# Patient Record
Sex: Female | Born: 1935 | Race: White | Hispanic: No | Smoking: Former smoker
Health system: Southern US, Community
[De-identification: ages and names within clinical notes are randomized; demographics above are authoritative.]

## PROBLEM LIST (undated history)

## (undated) DIAGNOSIS — R609 Edema, unspecified: Secondary | ICD-10-CM

## (undated) DIAGNOSIS — J40 Bronchitis, not specified as acute or chronic: Secondary | ICD-10-CM

## (undated) DIAGNOSIS — F329 Major depressive disorder, single episode, unspecified: Secondary | ICD-10-CM

## (undated) DIAGNOSIS — M4800 Spinal stenosis, site unspecified: Secondary | ICD-10-CM

## (undated) DIAGNOSIS — I509 Heart failure, unspecified: Secondary | ICD-10-CM

## (undated) DIAGNOSIS — H409 Unspecified glaucoma: Secondary | ICD-10-CM

## (undated) DIAGNOSIS — F32A Depression, unspecified: Secondary | ICD-10-CM

## (undated) DIAGNOSIS — E119 Type 2 diabetes mellitus without complications: Secondary | ICD-10-CM

## (undated) DIAGNOSIS — I429 Cardiomyopathy, unspecified: Secondary | ICD-10-CM

## (undated) DIAGNOSIS — K219 Gastro-esophageal reflux disease without esophagitis: Secondary | ICD-10-CM

## (undated) DIAGNOSIS — I38 Endocarditis, valve unspecified: Secondary | ICD-10-CM

## (undated) HISTORY — PX: APPENDECTOMY: SHX54

## (undated) HISTORY — PX: OTHER SURGICAL HISTORY: SHX169

## (undated) HISTORY — PX: KNEE SURGERY: SHX244

---

## 2012-05-29 ENCOUNTER — Emergency Department (HOSPITAL_COMMUNITY): Payer: Medicare Other

## 2012-05-29 ENCOUNTER — Encounter (HOSPITAL_COMMUNITY): Payer: Self-pay | Admitting: Emergency Medicine

## 2012-05-29 ENCOUNTER — Other Ambulatory Visit: Payer: Self-pay

## 2012-05-29 ENCOUNTER — Inpatient Hospital Stay (HOSPITAL_COMMUNITY)
Admission: EM | Admit: 2012-05-29 | Discharge: 2012-05-31 | DRG: 682 | Disposition: A | Discharge status: Home Health | Payer: Medicare Other | Attending: Internal Medicine | Admitting: Internal Medicine

## 2012-05-29 DIAGNOSIS — E872 Acidosis, unspecified: Secondary | ICD-10-CM

## 2012-05-29 DIAGNOSIS — I959 Hypotension, unspecified: Secondary | ICD-10-CM

## 2012-05-29 DIAGNOSIS — Z79899 Other long term (current) drug therapy: Secondary | ICD-10-CM

## 2012-05-29 DIAGNOSIS — Z6826 Body mass index (BMI) 26.0-26.9, adult: Secondary | ICD-10-CM

## 2012-05-29 DIAGNOSIS — Z66 Do not resuscitate: Secondary | ICD-10-CM | POA: Diagnosis present

## 2012-05-29 DIAGNOSIS — T40605A Adverse effect of unspecified narcotics, initial encounter: Secondary | ICD-10-CM | POA: Diagnosis present

## 2012-05-29 DIAGNOSIS — G9340 Encephalopathy, unspecified: Secondary | ICD-10-CM

## 2012-05-29 DIAGNOSIS — G9341 Metabolic encephalopathy: Secondary | ICD-10-CM

## 2012-05-29 DIAGNOSIS — E860 Dehydration: Secondary | ICD-10-CM

## 2012-05-29 DIAGNOSIS — I498 Other specified cardiac arrhythmias: Secondary | ICD-10-CM | POA: Diagnosis present

## 2012-05-29 DIAGNOSIS — R000 Tachycardia, unspecified: Secondary | ICD-10-CM

## 2012-05-29 DIAGNOSIS — N19 Unspecified kidney failure: Secondary | ICD-10-CM

## 2012-05-29 DIAGNOSIS — E875 Hyperkalemia: Secondary | ICD-10-CM

## 2012-05-29 DIAGNOSIS — Z7982 Long term (current) use of aspirin: Secondary | ICD-10-CM

## 2012-05-29 DIAGNOSIS — Z87891 Personal history of nicotine dependence: Secondary | ICD-10-CM

## 2012-05-29 DIAGNOSIS — N179 Acute kidney failure, unspecified: Principal | ICD-10-CM

## 2012-05-29 DIAGNOSIS — E46 Unspecified protein-calorie malnutrition: Secondary | ICD-10-CM

## 2012-05-29 HISTORY — DX: Heart failure, unspecified: I50.9

## 2012-05-29 LAB — URINALYSIS, ROUTINE W REFLEX MICROSCOPIC
Glucose, UA: NEGATIVE mg/dL
Leukocytes, UA: NEGATIVE
Nitrite: NEGATIVE
Specific Gravity, Urine: 1.018 (ref 1.005–1.030)
pH: 5 (ref 5.0–8.0)

## 2012-05-29 LAB — COMPREHENSIVE METABOLIC PANEL
Albumin: 3.4 g/dL — ABNORMAL LOW (ref 3.5–5.2)
BUN: 59 mg/dL — ABNORMAL HIGH (ref 6–23)
Calcium: 8.8 mg/dL (ref 8.4–10.5)
Creatinine, Ser: 2.89 mg/dL — ABNORMAL HIGH (ref 0.50–1.10)
GFR calc Af Amer: 15 mL/min — ABNORMAL LOW (ref >90)
Potassium: 6.2 mEq/L — ABNORMAL HIGH (ref 3.5–5.1)
Total Protein: 7.3 g/dL (ref 6.0–8.3)

## 2012-05-29 LAB — CBC
HCT: 28.4 % — ABNORMAL LOW (ref 36.0–46.0)
HCT: 24.8 % — ABNORMAL LOW (ref 36.0–46.0)
MCH: 30.7 pg (ref 26.0–34.0)
MCH: 31 pg (ref 26.0–34.0)
MCHC: 32 g/dL (ref 30.0–36.0)
MCHC: 32.7 g/dL (ref 30.0–36.0)
MCV: 95 fL (ref 78.0–100.0)
Platelets: 309 10*3/uL (ref 150–400)
RDW: 14.3 % (ref 11.5–15.5)
RDW: 14.2 % (ref 11.5–15.5)
WBC: 8 10*3/uL (ref 4.0–10.5)

## 2012-05-29 LAB — GLUCOSE, CAPILLARY: Glucose-Capillary: 73 mg/dL (ref 70–99)

## 2012-05-29 LAB — TROPONIN I
Troponin I: <0.3 ng/mL (ref <0.30)
Troponin I: <0.3 ng/mL (ref <0.30)

## 2012-05-29 LAB — CREATININE, URINE, RANDOM: Creatinine, Urine: 63.74 mg/dL

## 2012-05-29 LAB — URINE MICROSCOPIC-ADD ON

## 2012-05-29 LAB — CREATININE, SERUM: GFR calc Af Amer: 17 mL/min — ABNORMAL LOW (ref >90)

## 2012-05-29 MED ORDER — SODIUM CHLORIDE 0.9 % IV SOLN
Freq: Once | INTRAVENOUS | Status: AC
  Administered 2012-05-29: 1000 mL via INTRAVENOUS

## 2012-05-29 MED ORDER — SODIUM BICARBONATE 8.4 % IV SOLN
50.0000 meq | Freq: Once | INTRAVENOUS | Status: AC
  Administered 2012-05-29: 50 meq via INTRAVENOUS
  Filled 2012-05-29: qty 50

## 2012-05-29 MED ORDER — SODIUM CHLORIDE 0.9 % IV SOLN
INTRAVENOUS | Status: AC
  Administered 2012-05-29: 21:00:00 via INTRAVENOUS

## 2012-05-29 MED ORDER — ASPIRIN 81 MG PO CHEW
81.0000 mg | CHEWABLE_TABLET | Freq: Every day | ORAL | Status: DC
  Administered 2012-05-30 – 2012-05-31 (×2): 81 mg via ORAL
  Filled 2012-05-29 (×2): qty 1

## 2012-05-29 MED ORDER — NITROGLYCERIN 0.4 MG SL SUBL: 0.4000 mg | SUBLINGUAL_TABLET | SUBLINGUAL | Status: DC | PRN

## 2012-05-29 MED ORDER — SODIUM CHLORIDE 0.9 % IV SOLN
1.0000 g | Freq: Once | INTRAVENOUS | Status: AC
  Administered 2012-05-29: 1 g via INTRAVENOUS
  Filled 2012-05-29: qty 10

## 2012-05-29 MED ORDER — ACETAMINOPHEN 650 MG RE SUPP: 650.0000 mg | Freq: Four times a day (QID) | RECTAL | Status: DC | PRN

## 2012-05-29 MED ORDER — SODIUM POLYSTYRENE SULFONATE 15 GM/60ML PO SUSP
30.0000 g | Freq: Once | ORAL | Status: AC
  Administered 2012-05-29: 30 g via ORAL
  Filled 2012-05-29: qty 120

## 2012-05-29 MED ORDER — SODIUM CHLORIDE 0.9 % IJ SOLN: 3.0000 mL | Freq: Two times a day (BID) | INTRAMUSCULAR | Status: DC

## 2012-05-29 MED ORDER — SODIUM CHLORIDE 0.9 % IV BOLUS (SEPSIS)
1000.0000 mL | Freq: Once | INTRAVENOUS | Status: AC
  Administered 2012-05-29: 1000 mL via INTRAVENOUS

## 2012-05-29 MED ORDER — INSULIN ASPART 100 UNIT/ML ~~LOC~~ SOLN
6.0000 [IU] | Freq: Once | SUBCUTANEOUS | Status: AC
  Administered 2012-05-29: 6 [IU] via SUBCUTANEOUS
  Filled 2012-05-29: qty 1

## 2012-05-29 MED ORDER — ACETAMINOPHEN 325 MG PO TABS: 650.0000 mg | ORAL_TABLET | Freq: Four times a day (QID) | ORAL | Status: DC | PRN

## 2012-05-29 MED ORDER — INSULIN REGULAR HUMAN 100 UNIT/ML IJ SOLN: 6.0000 [IU] | Freq: Once | INTRAMUSCULAR | Status: DC

## 2012-05-29 MED ORDER — DEXTROSE 50 % IV SOLN
1.0000 | Freq: Once | INTRAVENOUS | Status: AC
  Administered 2012-05-29: 50 mL via INTRAVENOUS
  Filled 2012-05-29: qty 50

## 2012-05-29 MED ORDER — ONDANSETRON HCL 4 MG/2ML IJ SOLN: 4.0000 mg | Freq: Three times a day (TID) | INTRAMUSCULAR | Status: AC | PRN

## 2012-05-29 MED ORDER — ACETAMINOPHEN 500 MG PO TABS
1000.0000 mg | ORAL_TABLET | Freq: Three times a day (TID) | ORAL | Status: DC
Start: 2012-05-29 — End: 2012-05-31
  Administered 2012-05-30 – 2012-05-31 (×3): 1000 mg via ORAL
  Filled 2012-05-29 (×7): qty 2

## 2012-05-29 MED ORDER — BIMATOPROST 0.01 % OP SOLN
1.0000 [drp] | Freq: Every day | OPHTHALMIC | Status: DC
  Administered 2012-05-29: 1 [drp] via OPHTHALMIC
  Filled 2012-05-29: qty 2.5

## 2012-05-29 MED ORDER — ONDANSETRON HCL 4 MG PO TABS: 4.0000 mg | ORAL_TABLET | Freq: Four times a day (QID) | ORAL | Status: DC | PRN

## 2012-05-29 MED ORDER — HALOPERIDOL LACTATE 5 MG/ML IJ SOLN
1.0000 mg | Freq: Four times a day (QID) | INTRAMUSCULAR | Status: DC | PRN
  Filled 2012-05-29: qty 0.2

## 2012-05-29 MED ORDER — SODIUM CHLORIDE 0.9 % IV SOLN
INTRAVENOUS | Status: AC
  Administered 2012-05-30: 125 mL via INTRAVENOUS

## 2012-05-29 MED ORDER — HEPARIN SODIUM (PORCINE) 5000 UNIT/ML IJ SOLN
5000.0000 [IU] | Freq: Three times a day (TID) | INTRAMUSCULAR | Status: DC
  Administered 2012-05-30 – 2012-05-31 (×3): 5000 [IU] via SUBCUTANEOUS
  Filled 2012-05-29 (×8): qty 1

## 2012-05-29 MED ORDER — ONDANSETRON HCL 4 MG/2ML IJ SOLN: 4.0000 mg | Freq: Four times a day (QID) | INTRAMUSCULAR | Status: DC | PRN

## 2012-05-29 NOTE — ED Notes (Signed)
Pt sent here for decreased mental status from assisted living facility. Per family and staff patient normally awake, alert, follows commands. Currently NAD, oriented to person, not following commands. CBg 77. EMS IV  20g Lac. ?new Right bundle branch block.

## 2012-05-29 NOTE — ED Provider Notes (Addendum)
History     CSN: [id number]  Arrival date & time 05/29/12  1201   First MD Initiated Contact with Patient 05/29/12 1208      Chief Complaint  Patient presents with  . Altered Mental Status    (Consider location/radiation/quality/duration/timing/severity/associated sxs/prior treatment) HPI Comments: LEVEL 5 CAVEAT - ALTERED MENTAL STATUS  Pt comes in with cc of AMS from a nursing home. Per nursing home notes, patient has been "lethargic" and less responsive and altered than usual for the past 2-3 days. Her vitals have been WNL and stable, and they thought patient was possibly oversedated - and d/c'd her fentanyl patch, diazepam, clonazepam and ativan orders. Pt alert - but not responding to any questions appropriately. She appears dry. Denies any specific complains.  Patient is a 79 y.o. female presenting with altered mental status. The history is provided by medical records.  Altered Mental Status    History reviewed. No pertinent past medical history.  History reviewed. No pertinent past surgical history.  History reviewed. No pertinent family history.  History  Substance Use Topics  . Smoking status: Not on file  . Smokeless tobacco: Not on file  . Alcohol Use: Not on file    OB History    Grav Para Term Preterm Abortions TAB SAB Ect Mult Living                  Review of Systems  Unable to perform ROS: Mental status change  Psychiatric/Behavioral: Positive for altered mental status.    Allergies  Review of patient's allergies indicates no known allergies.  Home Medications   Current Outpatient Rx  Name  Route  Sig  Dispense  Refill  . ACETAMINOPHEN 500 MG PO TABS   Oral   Take 1,000 mg by mouth 3 (three) times daily.         . ASPIRIN 81 MG PO TABS   Oral   Take 81 mg by mouth daily.         Marland Kitchen BIMATOPROST 0.01 % OP SOLN   Both Eyes   Place 1 drop into both eyes at bedtime.         . FUROSEMIDE 20 MG PO TABS   Oral   Take 20 mg by mouth  2 (two) times daily.         Marland Kitchen METOCLOPRAMIDE HCL 5 MG PO TABS   Oral   Take 5 mg by mouth 4 (four) times daily -  before meals and at bedtime.         Marland Kitchen NITROGLYCERIN 0.4 MG SL SUBL   Sublingual   Place 0.4 mg under the tongue every 5 (five) minutes x 3 doses as needed. For chest pain         . POTASSIUM CHLORIDE CRYS ER 20 MEQ PO TBCR   Oral   Take 20 mEq by mouth 2 (two) times daily.         Marland Kitchen RANITIDINE HCL 150 MG PO TABS   Oral   Take 150 mg by mouth 2 (two) times daily.         Marland Kitchen ROPINIROLE HCL 2 MG PO TABS   Oral   Take 2 mg by mouth at bedtime.           BP 94/48  Pulse 92  Temp 100.7 F (38.2 C) (Oral)  Resp 17  SpO2 93%  Physical Exam  Nursing note and vitals reviewed. Constitutional: She appears well-developed and well-nourished.  HENT:  Head:  Normocephalic and atraumatic.  Eyes: EOM are normal. Pupils are equal, round, and reactive to light.  Neck: Neck supple. JVD present.  Cardiovascular: Normal rate and regular rhythm.   Murmur heard. Pulmonary/Chest: Effort normal. No respiratory distress. She has no wheezes. She has rales.       Left sided rales  Abdominal: Soft. Bowel sounds are normal. She exhibits no distension. There is no tenderness. There is no rebound and no guarding.  Neurological: She is alert.  Skin: Skin is warm and dry. No rash noted.    ED Course  Procedures (including critical care time)  Labs Reviewed  CBC - Abnormal; Notable for the following:    RBC 2.96 (*)     Hemoglobin 9.1 (*)     HCT 28.4 (*)     All other components within normal limits  MAGNESIUM  GLUCOSE, CAPILLARY  CG4 I-STAT (LACTIC ACID)  COMPREHENSIVE METABOLIC PANEL  URINALYSIS, ROUTINE W REFLEX MICROSCOPIC  TROPONIN I  URINE CULTURE  CULTURE, BLOOD (ROUTINE X 2)  CULTURE, BLOOD (ROUTINE X 2)   Dg Chest Port 1 View  05/29/2012  *RADIOLOGY REPORT*  Clinical Data: Short of breath  PORTABLE CHEST - 1 VIEW  Comparison: None.  Findings: Moderate  cardiomegaly.  Low volumes.  Linear atelectasis at the right base and left midlung zone.  Normal vascularity. Thorax is rotated to the left.  No obvious mediastinal mass effect. Status post vertebral plasty at three contiguous lower thoracic upper lumbar vertebral levels.  No Kerley B lines to suggest edema. Osteopenia.  No pneumothorax.  IMPRESSION: Cardiomegaly without edema.  Bilateral linear atelectasis.   Original Report Authenticated By: Jolaine Click, M.D.      No diagnosis found.    MDM   Date: 05/29/2012  Rate: 98  Rhythm: atrial fibrillation  QRS Axis: left  Intervals: normal  ST/T Wave abnormalities: nonspecific ST/T changes  Conduction Disutrbances:none  Narrative Interpretation:   Old EKG Reviewed: none available  DDx: Sepsis syndrome ACS syndrome DKA ICH Stroke CHF exacerbation Infection - pneumonia/UTI/Cellulitis Dehydration Electrolyte abnormality Tox syndrome Encephalopathy  Pt comes in with cc of AMS. Pt's initial vital showed tachycardia with BP in the 80s - however, with minimal fluid her pressure has responded. She appears dry as well on the exam.  Initial exam shows confused patient, with some left sided rales and dry mucus membranes. She had mild abd tenderness - no rebound, guarding, and the tenderness was not focal.  DDx is broad. We will get infection, ACS workup and also hydrate. I dont have acute concerns for meningeal process at this time. Likely admission.  Derwood Kaplan, MD 05/29/12 1414  5:01 PM Pt noted to have AKI, prerenal - with FENA < 1% She has hyperkalemia, and elevated BUN. We have started hydration here. Nephrology notified, and recommending avoiding nephrotoxins and continuing hydration for now - they should be re-consulted if needed.    Derwood Kaplan, MD 05/29/12 [phone]

## 2012-05-29 NOTE — ED Notes (Signed)
Results of lactic acid shown to primary nurse 

## 2012-05-29 NOTE — Progress Notes (Signed)
NURSING PROGRESS NOTE  [name] [id number] Admission Data: 05/29/2012 7:35 PM Attending Provider: Marinda Elk, MD PCP:No primary provider on file. Code Status: dnr   Candice Barker is a 79 y.o. female patient admitted from ED  No acute distress noted.  No c/o shortness of breath, no c/o chest pain.  Cardiac tele # [phone], in place, cardiac monitor yields:normal sinus rhythm.  Blood pressure 99/62, pulse 101, temperature 98.3 F (36.8 C), temperature source Oral, resp. rate 16, height 5' (1.524 m), weight 62.6 kg (138 lb 0.1 oz), SpO2 97.00%.   IV Fluids:  IV in place, occlusive dsg intact without redness, IV cath antecubital left, condition patent and no redness normal saline.   Allergies:  Review of patient's allergies indicates no known allergies.  Past Medical History:   has a past medical history of Heart failure.  Past Surgical History:   has no past surgical history on file.  Social History:   reports that she has quit smoking. Her smoking use included Cigarettes. She smoked 1 pack per day. She does not have any smokeless tobacco history on file. She reports that she does not drink alcohol or use illicit drugs.  Skin: to be assessed by oncoming shift  Orientation to room, and floor completed with information packet given to patient/family. Admission INP armband ID verified with patient/family, and in place.   SR up x 2, fall assessment complete, with patient and family able to verbalize understanding of risk associated with falls, and verbalized understanding to call for assistance before getting out of bed.   Call light within reach. Patient able to voice and demonstrate understanding of unit orientation instructions.   Will cont to eval and treat per MD orders.  Rosette Bellavance, Elmarie Mainland, RN

## 2012-05-29 NOTE — H&P (Addendum)
[facility] History and Physical  Candice Barker ZOX:[id number] DOB: [birth year] DOA: 05/29/2012  Referring physician: Dr. Janey Genta PCP: No primary provider on file.  Specialists: none  Chief Complaint: Altered mental status  HPI: Candice Barker is a 79 y.o. female  Hard of hearing, past medical history of heart failure probable nonischemic according to family (they relates she has mitral valve insufficiency). That comes in for altered mental status since Saturday. The patient is new to [facility] she was just transfer from a skilled nursing facility  from out of town, 1 week prior to admission. Her pain medication was changed from fentanyl patch morphine on the same day, 4 days prior to admission and by the next day she started getting slightly confused progressively getting worst. Eating and drinking less to the point where she was not even able to pick up a spoon to feed herself. Previously to this the family relates she was a very active and very jovial person. So on the day of admission she was not able to communicate and slumped over on her side in the bed so they decided to bring her to [facility] for further evaluation. Your muscles, she was given a little bit of IV fluid and he started to wake up. A Foley was placed and she could about 600 cc of urine. A basic metabolic panel was done showed an elevated creatinine (the family relates she has never had real problems) he showed elevated potassium, an EKG was done that shows a right bundle branch block with no T wave abnormalities no peak T waves. Some were asked to admit and further evaluate.  Review of Systems: The patient denies anorexia, fever, weight loss,, vision loss, decreased hearing, hoarseness, chest pain, syncope, dyspnea on exertion, peripheral edema, balance deficits, hemoptysis, abdominal pain, melena, hematochezia, severe indigestion/heartburn, hematuria, incontinence, genital sores, muscle weakness, suspicious skin lesions,  transient blindness, difficulty walking, depression, unusual weight change, abnormal bleeding, enlarged lymph nodes, angioedema, and breast masses.    Past Medical History  Diagnosis Date  . Heart failure    History reviewed. No pertinent past surgical history. Social History:  reports that she has quit smoking. Her smoking use included Cigarettes. She smoked 1 pack per day. She does not have any smokeless tobacco history on file. She reports that she does not drink alcohol or use illicit drugs. This is an assisted living facility able to ambulate with a walker.  No Known Allergies  Family History  Problem Relation Age of Onset  . Other Mother   . Other Father     Prior to Admission medications   Medication Sig Start Date End Date Taking? Authorizing Provider  acetaminophen (TYLENOL) 500 MG tablet Take 1,000 mg by mouth 3 (three) times daily.   Yes Historical Provider, MD  aspirin 81 MG tablet Take 81 mg by mouth daily.   Yes Historical Provider, MD  bimatoprost (LUMIGAN) 0.01 % SOLN Place 1 drop into both eyes at bedtime.   Yes Historical Provider, MD  furosemide (LASIX) 20 MG tablet Take 20 mg by mouth 2 (two) times daily.   Yes Historical Provider, MD  metoCLOPramide (REGLAN) 5 MG tablet Take 5 mg by mouth 4 (four) times daily -  before meals and at bedtime.   Yes Historical Provider, MD  nitroGLYCERIN (NITROSTAT) 0.4 MG SL tablet Place 0.4 mg under the tongue every 5 (five) minutes x 3 doses as needed. For chest pain   Yes Historical Provider, MD  potassium chloride SA (K-DUR,KLOR-CON) 20  MEQ tablet Take 20 mEq by mouth 2 (two) times daily.   Yes Historical Provider, MD  ranitidine (ZANTAC) 150 MG tablet Take 150 mg by mouth 2 (two) times daily.   Yes Historical Provider, MD  rOPINIRole (REQUIP) 2 MG tablet Take 2 mg by mouth at bedtime.   Yes Historical Provider, MD   Physical Exam: Filed Vitals:   05/30/12 0417 05/30/12 1320 05/30/12 2121 05/31/12 0534  BP: 109/62 108/61  116/71 144/83  Pulse: 89 95 95 93  Temp: 98.3 F (36.8 C) 99.4 F (37.4 C) 98.9 F (37.2 C) 97.7 F (36.5 C)  TempSrc: Oral Oral Oral Oral  Resp: 12 18 16 20   Height:      Weight:      SpO2:  95% 97% 97%     General:  She is alert to person in no acute distress hard of hearing  Eyes: Anicteric  ENT: Dry mucous membranes cracked tongue  Neck: No JVD  Cardiovascular: Regular rate and rhythm with positive S1 and S2 no rubs or gallops she has a 3/6 systolic murmur in the mitral valve area that radiates to her axilla  Respiratory: Good air movement and clear to auscultation except on her right lower lobe she has a little crackles  Abdomen: Positive bowel sounds nontender nondistended soft  Skin: No rashes or ulcerations  Musculoskeletal: Intact  Psychiatric: Confused not able to follow commands  Neurologic: Awake alert and oriented x 3-12 are grossly intact she is moving all 4 extremities without any difficulties no slurred speech or facial droop deep tendon reflexes are 2+ bilaterally. Babinski's negative.  Labs on Admission:  Basic Metabolic Panel:  Lab 05/30/12 1610 05/29/12 1936 05/29/12 1312 05/29/12 1307  NA 139 -- -- 134*  K 4.8 -- -- 6.2*  CL 108 -- -- 101  CO2 17* -- -- 16*  GLUCOSE 84 -- -- 90  BUN 46* -- -- 59*  CREATININE 2.16* 2.57* -- 2.89*  CALCIUM 8.4 8.6 -- 8.8  MG -- 1.9 2.1 --  PHOS -- -- -- --   Liver Function Tests:  Lab 05/30/12 0730 05/29/12 1307  AST 14 22  ALT 7 8  ALKPHOS 55 66  BILITOT 0.2* 0.2*  PROT 6.4 7.3  ALBUMIN 2.9* 3.4*   No results found for this basename: LIPASE:5,AMYLASE:5 in the last 168 hours No results found for this basename: AMMONIA:5 in the last 168 hours CBC:  Lab 05/30/12 0730 05/29/12 1936 05/29/12 1307  WBC 6.0 8.0 7.0  NEUTROABS -- -- --  HGB 8.2* 8.1* 9.1*  HCT 25.2* 24.8* 28.4*  MCV 96.2 95.0 95.9  PLT 298 309 296   Cardiac Enzymes:  Lab 05/30/12 0730 05/30/12 0021 05/29/12 1936 05/29/12 1308   CKTOTAL -- -- 513* --  CKMB -- -- -- --  CKMBINDEX -- -- -- --  TROPONINI <0.30 <0.30 <0.30 <0.30    BNP (last 3 results) No results found for this basename: PROBNP:3 in the last 8760 hours CBG:  Lab 05/31/12 0755 05/30/12 2127 05/30/12 1648 05/30/12 1305 05/30/12 0804  GLUCAP 89 96 100* 89 74    Radiological Exams on Admission: Ct Head Wo Contrast  05/29/2012  *RADIOLOGY REPORT*  Clinical Data: Altered mental status.  Lethargy.  CT HEAD WITHOUT CONTRAST  Technique:  Contiguous axial images were obtained from the base of the skull through the vertex without contrast.  Comparison: None.  Findings: There is no evidence of acute intracranial hemorrhage, mass effect, brain edema or extra-axial fluid  collection.  The ventricles and subarachnoid spaces are diffusely prominent consistent with moderate atrophy. Diffuse periventricular low density is most compatible with mild chronic small vessel ischemic change.   No acute cortical infarction is seen.  The visualized paranasal sinuses are clear. The calvarium is intact.  IMPRESSION: Generalized atrophy and mild chronic small vessel ischemic changes. No acute or reversible findings identified.   Original Report Authenticated By: Carey Bullocks, M.D.    US Renal  05/29/2012  *RADIOLOGY REPORT*  Clinical Data: Renal insufficiency  RENAL / URINARY TRACT ULTRASOUND  Technique:  Complete ultrasound exam of the kidneys and urinary bladder was performed.  Comparison: No comparison studies available.  Findings:  The right kidney measures 8.8 cm in long axis.  The left kidney measures 8.5 cm.  Right kidney shows diffuse cortical thinning with increased echogenicity of the renal cortex.  The left kidney is also with evidence of increased echogenicity and diffuse cortical thinning.  7.1 x 5.7 cm cystic lesion with an apparent internal septation appears to arise from the interpolar region of the left kidney.  Midline imaging in the anatomic pelvis shows a decompressed  bladder in this patient with a Foley catheter in place.  Impression:  Bilateral small, atrophic, echogenic kidneys, consistent with medical renal disease.  7.1 cm cystic structure adjacent to the left kidney appears to arise from the interpolar region and contain a single thin internal septation.  Imaging features would be compatible with a Bosniak II category cyst.   Original Report Authenticated By: Kennith Center, M.D.    Dg Chest Port 1 View  05/30/2012  *RADIOLOGY REPORT*  Clinical Data: Cough and wheezing.  PORTABLE CHEST - 1 VIEW  Comparison: 05/29/2012  Findings: Osteopenia with at least one remote right rib fracture. Numerous leads and wires project over the chest.  Mild tracheal deviation to the left could be positional or due to a right-sided thyroid goiter.  Cardiomegaly with atherosclerosis in the transverse aorta.  No right and no definite left pleural effusion; breast tissue projects over the left costophrenic angle on the frontal.  No pneumothorax.  No congestive failure.  Patchy right base scarring or atelectasis. Lower thoracic vertebral augmentation.  IMPRESSION: Cardiomegaly without acute disease.   Original Report Authenticated By: Jeronimo Greaves, M.D.    Dg Chest Port 1 View  05/29/2012  *RADIOLOGY REPORT*  Clinical Data: Short of breath  PORTABLE CHEST - 1 VIEW  Comparison: None.  Findings: Moderate cardiomegaly.  Low volumes.  Linear atelectasis at the right base and left midlung zone.  Normal vascularity. Thorax is rotated to the left.  No obvious mediastinal mass effect. Status post vertebral plasty at three contiguous lower thoracic upper lumbar vertebral levels.  No Kerley B lines to suggest edema. Osteopenia.  No pneumothorax.  IMPRESSION: Cardiomegaly without edema.  Bilateral linear atelectasis.   Original Report Authenticated By: Jolaine Click, M.D.     EKG: Sinus tachycardia with right bundle branch block possible left atrial enlargement, no peak T  waves.  Assessment/Plan Metabolic encephalopathy: - Multifactorial most likely secondary to narcotic use and acute kidney injury. Her family relate that she does have a new cough. They relate that she has been coughing with food, aspiration pneumonia is also in the differential. Chest x-ray does not show any new infiltrates, she is afebrile does not have a leukocytosis.  - I will go ahead and start her on IV fluids and repeat a chest x-ray in the morning. As the family is complaining that she does  have a new cough.  - I will go ahead and start her on IV fluids. We'll hold her narcotics, benzodiazepines  and Requip as these may be contributing to her altered mental status and probably accumulated due to of her acute renal failure.  Acute kidney injury: - This most likely secondary to decrease by mouth intake and ongoing Lasix use. The specific gravity on her urine as 1020. I will go ahead and hold her Lasix and start her on aggressive fluid hydration.  - Check a basic metabolic panel in the morning.  - Her UA does show large amounts of hemoglobin but only 0-2 red cells on microscopy. Will check a CK to look for rhabdomyolysis.  Hyperkalemia: - She is on potassium supplements at home and she was taken this will having acute kidney injury. She has gotten one dose of Kayexalate here in the ED. Her EKG does not show any peaked T waves. Home monitor on telemetry check a basic metabolic panel in the morning.  - Also am assuming that with hydration this will continue to improve so we'll check in the morning.   Metabolic acidosis: - This probably secondary to her decreased intravascular volume and acute renal failure. Her lactate was checked it was within normal limits. He'll continue IV hydration and check a basic metabolic panel in the morning.  Protein malnutrition: - Her autoimmune here on admission was 3.5 I am sure it's drop after hydration, I will place her n.p.o. in case she is aspirating.  After swallowing evaluation to start her on a regular diet and Ensure 3 times a day.  Hypotension: - This most likely secondary to decreased intravascular volume. Her lactate was checked which was less than 1. I will call him on her blood pressure medications. And continue IV hydration and close monitoring of her vital signs.  Sinus tachycardia: - Sinus tachycardia improved slightly with IV hydration here in the emergency room. Questionable ulcer 500 cc and continue her on IV normal saline. We'll continue check should I&O's  Code Status: DNR Family Communication: son POA: [phone] Disposition Plan: SNF in 2-3 days  Time spent: 80 minutes  Marinda Elk [facility] Pager [phone]  If 7PM-7AM, please contact night-coverage [email or web address] Password [facility] 05/31/2012, 9:52 AM

## 2012-05-30 ENCOUNTER — Inpatient Hospital Stay (HOSPITAL_COMMUNITY): Payer: Medicare Other

## 2012-05-30 LAB — COMPREHENSIVE METABOLIC PANEL
ALT: 7 U/L (ref 0–35)
AST: 14 U/L (ref 0–37)
Albumin: 2.9 g/dL — ABNORMAL LOW (ref 3.5–5.2)
Alkaline Phosphatase: 55 U/L (ref 39–117)
Calcium: 8.4 mg/dL (ref 8.4–10.5)
GFR calc Af Amer: 21 mL/min — ABNORMAL LOW (ref >90)
Potassium: 4.8 mEq/L (ref 3.5–5.1)
Sodium: 139 mEq/L (ref 135–145)
Total Protein: 6.4 g/dL (ref 6.0–8.3)

## 2012-05-30 LAB — TROPONIN I
Troponin I: <0.3 ng/mL (ref <0.30)
Troponin I: <0.3 ng/mL (ref <0.30)

## 2012-05-30 LAB — CBC
HCT: 25.2 % — ABNORMAL LOW (ref 36.0–46.0)
Hemoglobin: 8.2 g/dL — ABNORMAL LOW (ref 12.0–15.0)
MCHC: 32.5 g/dL (ref 30.0–36.0)
MCV: 96.2 fL (ref 78.0–100.0)
RDW: 14.2 % (ref 11.5–15.5)

## 2012-05-30 LAB — GLUCOSE, CAPILLARY
Glucose-Capillary: 89 mg/dL (ref 70–99)
Glucose-Capillary: 100 mg/dL — ABNORMAL HIGH (ref 70–99)
Glucose-Capillary: 96 mg/dL (ref 70–99)

## 2012-05-30 LAB — VITAMIN B12: Vitamin B-12: 325 pg/mL (ref 211–911)

## 2012-05-30 LAB — URINE CULTURE

## 2012-05-30 LAB — TSH: TSH: 2.518 u[IU]/mL (ref 0.350–4.500)

## 2012-05-30 MED ORDER — ROPINIROLE HCL 1 MG PO TABS
2.0000 mg | ORAL_TABLET | Freq: Every day | ORAL | Status: DC
  Administered 2012-05-30: 2 mg via ORAL
  Filled 2012-05-30 (×3): qty 2

## 2012-05-30 NOTE — Discharge Summary (Signed)
[facility] PROGRESS NOTE Assessment/Plan: Metabolic encephalopathy (05/29/2012) - Multifactorial most likely secondary to narcotic use and acute kidney injury. More awake today, continue current management. - repeated x-ray shows no infectious etiology. - continue to hold narcotics and BZD's. - she was on a fentanyl patch on ALF then change to morphine tablets. - Get PT/OT, check B12 ,  Rbc folate. - TSH WNL.  Acute kidney injury (05/29/2012) - This most likely secondary to decrease by mouth intake and ongoing Lasix use. - Cr. Is slowly improving. - b-met in am.  Hyperkalemia: - resolved with kayexalate.  Metabolic acidosis: - improving with IV fluids. - strict I and O's.  Protein malnutrition (05/29/2012) - awaiting, SLP.  Hypotension: - resolved with IV fluids.  Sinus tachycardia: - resolved, with IV fluids.  Code Status: DNR  Family Communication: son POA: [phone]  Disposition Plan: SNF in 2-3 days   Consultants:  none  Procedures:  none  Antibiotics:  HPI/Subjective: no complains.  Objective: Filed Vitals:   05/29/12 1759 05/29/12 1840 05/29/12 2220 05/30/12 0417  BP: 125/65 99/62 90/57  109/62  Pulse: 115 101 94 89  Temp: 98.1 F (36.7 C) 98.3 F (36.8 C) 98.7 F (37.1 C) 98.3 F (36.8 C)  TempSrc: Oral Oral Oral Oral  Resp: 20 16 16 12   Height:  5' (1.524 m)    Weight:  62.6 kg (138 lb 0.1 oz)    SpO2: 96% 97% 93%     Intake/Output Summary (Last 24 hours) at 05/30/12 0949 Last data filed at 05/30/12 0500  Gross per 24 hour  Intake    625 ml  Output    450 ml  Net    175 ml   Filed Weights   05/29/12 1840  Weight: 62.6 kg (138 lb 0.1 oz)    Exam:  General: Alert, awake, oriented x3, in no acute distress.  HEENT: No bruits, no goiter.  Heart: Regular rate and rhythm, without murmurs, rubs, gallops.  Lungs: Good air movement, bilateral air movement.  Abdomen: Soft, nontender, nondistended, positive bowel sounds.  Neuro:  Grossly intact, nonfocal.   Data Reviewed: Basic Metabolic Panel:  Lab 05/30/12 0981 05/29/12 1936 05/29/12 1312 05/29/12 1307  NA 139 -- -- 134*  K 4.8 -- -- 6.2*  CL 108 -- -- 101  CO2 17* -- -- 16*  GLUCOSE 84 -- -- 90  BUN 46* -- -- 59*  CREATININE 2.16* 2.57* -- 2.89*  CALCIUM 8.4 8.6 -- 8.8  MG -- 1.9 2.1 --  PHOS -- -- -- --   Liver Function Tests:  Lab 05/30/12 0730 05/29/12 1307  AST 14 22  ALT 7 8  ALKPHOS 55 66  BILITOT 0.2* 0.2*  PROT 6.4 7.3  ALBUMIN 2.9* 3.4*   No results found for this basename: LIPASE:5,AMYLASE:5 in the last 168 hours No results found for this basename: AMMONIA:5 in the last 168 hours CBC:  Lab 05/30/12 0730 05/29/12 1936 05/29/12 1307  WBC 6.0 8.0 7.0  NEUTROABS -- -- --  HGB 8.2* 8.1* 9.1*  HCT 25.2* 24.8* 28.4*  MCV 96.2 95.0 95.9  PLT 298 309 296   Cardiac Enzymes:  Lab 05/30/12 0730 05/30/12 0021 05/29/12 1936 05/29/12 1308  CKTOTAL -- -- 513* --  CKMB -- -- -- --  CKMBINDEX -- -- -- --  TROPONINI <0.30 <0.30 <0.30 <0.30   BNP (last 3 results) No results found for this basename: PROBNP:3 in the last 8760 hours CBG:  Lab 05/30/12 0804 05/29/12 1305  GLUCAP 74 73    Recent Results (from the past 240 hour(s))  CULTURE, BLOOD (ROUTINE X 2)     Status: Normal (Preliminary result)   Collection Time   05/29/12  1:20 PM      Component Value Range Status Comment   Specimen Description BLOOD ARM RIGHT   Final    Special Requests BOTTLES DRAWN AEROBIC ONLY 5CC   Final    Culture  Setup Time 05/29/2012 17:04   Final    Culture     Final    Value:        BLOOD CULTURE RECEIVED NO GROWTH TO DATE CULTURE WILL BE HELD FOR 5 DAYS BEFORE ISSUING A FINAL NEGATIVE REPORT   Report Status PENDING   Incomplete   CULTURE, BLOOD (ROUTINE X 2)     Status: Normal (Preliminary result)   Collection Time   05/29/12  1:25 PM      Component Value Range Status Comment   Specimen Description BLOOD HAND RIGHT   Final    Special Requests BOTTLES  DRAWN AEROBIC ONLY 5CC   Final    Culture  Setup Time 05/29/2012 17:04   Final    Culture     Final    Value:        BLOOD CULTURE RECEIVED NO GROWTH TO DATE CULTURE WILL BE HELD FOR 5 DAYS BEFORE ISSUING A FINAL NEGATIVE REPORT   Report Status PENDING   Incomplete      Studies: Ct Head Wo Contrast  05/29/2012  *RADIOLOGY REPORT*  Clinical Data: Altered mental status.  Lethargy.  CT HEAD WITHOUT CONTRAST  Technique:  Contiguous axial images were obtained from the base of the skull through the vertex without contrast.  Comparison: None.  Findings: There is no evidence of acute intracranial hemorrhage, mass effect, brain edema or extra-axial fluid collection.  The ventricles and subarachnoid spaces are diffusely prominent consistent with moderate atrophy. Diffuse periventricular low density is most compatible with mild chronic small vessel ischemic change.   No acute cortical infarction is seen.  The visualized paranasal sinuses are clear. The calvarium is intact.  IMPRESSION: Generalized atrophy and mild chronic small vessel ischemic changes. No acute or reversible findings identified.   Original Report Authenticated By: Carey Bullocks, M.D.    US Renal  05/29/2012  *RADIOLOGY REPORT*  Clinical Data: Renal insufficiency  RENAL / URINARY TRACT ULTRASOUND  Technique:  Complete ultrasound exam of the kidneys and urinary bladder was performed.  Comparison: No comparison studies available.  Findings:  The right kidney measures 8.8 cm in long axis.  The left kidney measures 8.5 cm.  Right kidney shows diffuse cortical thinning with increased echogenicity of the renal cortex.  The left kidney is also with evidence of increased echogenicity and diffuse cortical thinning.  7.1 x 5.7 cm cystic lesion with an apparent internal septation appears to arise from the interpolar region of the left kidney.  Midline imaging in the anatomic pelvis shows a decompressed bladder in this patient with a Foley catheter in place.   Impression:  Bilateral small, atrophic, echogenic kidneys, consistent with medical renal disease.  7.1 cm cystic structure adjacent to the left kidney appears to arise from the interpolar region and contain a single thin internal septation.  Imaging features would be compatible with a Bosniak II category cyst.   Original Report Authenticated By: Kennith Center, M.D.    Dg Chest Port 1 View  05/30/2012  *RADIOLOGY REPORT*  Clinical Data: Cough and wheezing.  PORTABLE CHEST - 1 VIEW  Comparison: 05/29/2012  Findings: Osteopenia with at least one remote right rib fracture. Numerous leads and wires project over the chest.  Mild tracheal deviation to the left could be positional or due to a right-sided thyroid goiter.  Cardiomegaly with atherosclerosis in the transverse aorta.  No right and no definite left pleural effusion; breast tissue projects over the left costophrenic angle on the frontal.  No pneumothorax.  No congestive failure.  Patchy right base scarring or atelectasis. Lower thoracic vertebral augmentation.  IMPRESSION: Cardiomegaly without acute disease.   Original Report Authenticated By: Jeronimo Greaves, M.D.    Dg Chest Port 1 View  05/29/2012  *RADIOLOGY REPORT*  Clinical Data: Short of breath  PORTABLE CHEST - 1 VIEW  Comparison: None.  Findings: Moderate cardiomegaly.  Low volumes.  Linear atelectasis at the right base and left midlung zone.  Normal vascularity. Thorax is rotated to the left.  No obvious mediastinal mass effect. Status post vertebral plasty at three contiguous lower thoracic upper lumbar vertebral levels.  No Kerley B lines to suggest edema. Osteopenia.  No pneumothorax.  IMPRESSION: Cardiomegaly without edema.  Bilateral linear atelectasis.   Original Report Authenticated By: Jolaine Click, M.D.     Scheduled Meds:   . acetaminophen  1,000 mg Oral TID  . aspirin  81 mg Oral Daily  . bimatoprost  1 drop Both Eyes QHS  . heparin  5,000 Units Subcutaneous Q8H  . sodium chloride  3 mL  Intravenous Q12H   Continuous Infusions:   . sodium chloride 125 mL (05/30/12 0819)     Marinda Elk  [facility] Pager [phone]. If 8PM-8AM, please contact night-coverage at [email or web address], password [facility] 05/30/2012, 9:49 AM  LOS: 1 day

## 2012-05-30 NOTE — Care Management Note (Signed)
    Page 1 of 1   05/31/2012     2:30:42 PM   CARE MANAGEMENT NOTE 05/31/2012  Patient:  Candice Barker, Candice Barker   Account Number:  [id number]  Date Initiated:  05/30/2012  Documentation initiated by:  Letha Cape  Subjective/Objective Assessment:   dx metabolic encephalopathy  admit- from [facility]  grand daughter n law is Pam [phone].     Action/Plan:   return to [facility] with pt and ot which will be placed on the fl2- they provide this at faciltiy.   Anticipated DC Date:  06/03/2012   Anticipated DC Plan:  ASSISTED LIVING / REST HOME  In-house referral  Clinical Social Worker      DC Planning Services  CM consult      Choice offered to / List presented to:             Status of service:  Completed, signed off Medicare Important Message given?   (If response is "NO", the following Medicare IM given date fields will be blank) Date Medicare IM given:   Date Additional Medicare IM given:    Discharge Disposition:  ASSISTED LIVING  Per UR Regulation:  Reviewed for med. necessity/level of care/duration of stay  If discussed at Long Length of Stay Meetings, dates discussed:    Comments:  05/31/12  14:28  Anette Guarneri RN/CM Spoke with Luther Parody [place]/CSW, per Luther Parody HHPT/OT/RN will be provided at ALF, no further d/c needs noted.    05/30/12 16:44 Letha Cape RN, BSN [phone] patient is from [facility] and will return there on Monday , they provide pt/ot at the facility, it will just need to be on the fl2. CSW following.

## 2012-05-30 NOTE — Evaluation (Signed)
Physical Therapy Evaluation Patient Details Name: Candice Barker MRN: [id number] DOB: 1916/10/03 Today's Date: 05/30/2012 Time: 0272-5366 PT Time Calculation (min): 45 min  PT Assessment / Plan / Recommendation Clinical Impression  Pt is a 79 yo female admitted with metabolic encephalopathy. Pt is very HOH which limits her ability to participate and follow commands. Pt daughter-in-law was present for evaluation. She reported the patient was recently accepted to [facility] for help with mobility. Per the family, the pt must be +1 assist to return to the facility. Pt does presents with dependencies in mobility affecting her Independence.  Pt would benefit from skilled PT to maxamize mobility and Independence for return back to ALF.    PT Assessment  Patient needs continued PT services    Follow Up Recommendations  Home health PT;Other (comment) (HHPT at ALF or SNF if ALF doesn't accept pt back)    Does the patient have the potential to tolerate intense rehabilitation      Barriers to Discharge        Equipment Recommendations  None recommended by PT    Recommendations for Other Services     Frequency Min 3X/week    Precautions / Restrictions Precautions Precautions: Fall Precaution Comments: fragile skin Restrictions Weight Bearing Restrictions: No   Pertinent Vitals/Pain       Mobility  Bed Mobility Bed Mobility: Left Sidelying to Sit Left Sidelying to Sit: 2: Max assist Details for Bed Mobility Assistance: max tactile cues for technique due to [facility] Transfers Transfers: Editor, commissioning Transfers: 3: Mod assist Details for Transfer Assistance: max tactile and visual cues for technique Ambulation/Gait Ambulation/Gait Assistance: Not tested (comment)    Exercises     PT Diagnosis: Difficulty walking;Generalized weakness  PT Problem List: Decreased strength;Decreased activity tolerance;Decreased balance;Decreased mobility;Decreased range of  motion;Decreased knowledge of use of DME;Decreased safety awareness;Decreased skin integrity PT Treatment Interventions: DME instruction;Gait training;Functional mobility training;Therapeutic activities;Therapeutic exercise;Balance training;Patient/family education   PT Goals Acute Rehab PT Goals PT Goal Formulation: With patient/family Time For Goal Achievement: 06/13/12 Potential to Achieve Goals: Fair Pt will go Supine/Side to Sit: with min assist PT Goal: Supine/Side to Sit - Progress: Goal set today Pt will go Sit to Supine/Side: with min assist PT Goal: Sit to Supine/Side - Progress: Goal set today Pt will go Sit to Stand: with min assist PT Goal: Sit to Stand - Progress: Goal set today Pt will go Stand to Sit: with min assist PT Goal: Stand to Sit - Progress: Goal set today Pt will Transfer Bed to Chair/Chair to Bed: with min assist PT Transfer Goal: Bed to Chair/Chair to Bed - Progress: Goal set today Pt will Ambulate: 1 - 15 feet;with min assist PT Goal: Ambulate - Progress: Goal set today  Visit Information  Last PT Received On: 05/30/12 Assistance Needed: +1    Subjective Data  Subjective: Noded in agreement to get into a chair. Patient Stated Goal: Unable to state   Prior Functioning  Home Living Type of Home: Assisted living [facility]) Prior Function Level of Independence: Needs assistance Needs Assistance: Transfers;Gait Gait Assistance: +1 assist with very short distance ambulation to chair or BSC Transfer Assistance: +1 assist Able to Take Stairs?: No Driving: No Communication Communication: HOH (very HOH, speak to left ear)    Cognition  Cognition Overall Cognitive Status: Difficult to assess Difficult to assess due to: Hard of hearing/deaf Arousal/Alertness: Awake/alert Orientation Level: Appears intact for tasks assessed Behavior During Session: [facility] for tasks performed  Extremity/Trunk Assessment Right Lower Extremity Assessment RLE  ROM/Strength/Tone: Deficits RLE ROM/Strength/Tone Deficits: with functional activities 2-3/5 Left Lower Extremity Assessment LLE ROM/Strength/Tone: Deficits LLE ROM/Strength/Tone Deficits: with functional activities 2-3/5   Balance Balance Balance Assessed: Yes Static Sitting Balance Static Sitting - Balance Support: Right upper extremity supported Static Sitting - Level of Assistance: 5: Stand by assistance Static Sitting - Comment/# of Minutes: flexed trunk and posterior pelvic tilt Static Standing Balance Static Standing - Balance Support: Bilateral upper extremity supported Static Standing - Level of Assistance: 3: Mod assist  End of Session PT - End of Session Equipment Utilized During Treatment: Gait belt Activity Tolerance: Patient tolerated treatment well Patient left: in chair;with call bell/phone within reach;with family/visitor present;with nursing in room Nurse Communication: Mobility status  GP     Candice Barker 05/30/2012, 1:26 PM

## 2012-05-30 NOTE — Clinical Social Work Note (Signed)
Clinical Social Work Department BRIEF PSYCHOSOCIAL ASSESSMENT 05/30/2012  Patient:  Candice Barker, Candice Barker     Account Number:  [id number]     Admit date:  05/29/2012  Clinical Social Worker:  Johnsie Cancel  Date/Time:  05/30/2012 04:00 PM  Referred by:  Physician  Date Referred:  05/30/2012 Referred for  SNF Placement   Other Referral:   Interview type:  Patient Other interview type:   Family: Grandson [name]) and Granddaughter  in Social worker Elita Quick)    PSYCHOSOCIAL DATA Living Status:  FACILITY Admitted from facility:  [facility] [address] Level of care:  Assisted Living Primary support name:  [name] [phone]) Primary support relationship to patient:  FAMILY Degree of support available:   Adequate, at patient's bedside.    CURRENT CONCERNS Current Concerns  Post-Acute Placement   Other Concerns:    SOCIAL WORK ASSESSMENT / PLAN CSW consulted re: SNF vs return to ALF. CSW performed chart review, and PT recommended ALF return if they could provide PT/OT. CSW met with the family's son at bedside to discuss disposition options. Patient's grandson stated he wanted the patient to return to her facility if they are able to provide PT. CSW provided support for patient and family. CSW contacted [employer] and spoke to [employer], who stated they had own PT provider "[facility]" and requested MD write the orders to them. Facility stated the patient could d/c on Monday if medically stable. CSW will continue to follow patient for d/c.   Assessment/plan status:  Information/Referral to [facility]  PATIENT'S/FAMILY'S RESPONSE TO PLAN OF CARE: Patient and patient's family thanked CSW for providing support, and assisting in d/c.    Lia Foyer, LCSWA [facility] Clinical Social Worker Contact #: [phone]

## 2012-05-30 NOTE — Evaluation (Signed)
Clinical/Bedside Swallow Evaluation Patient Details  Name: Candice Barker MRN: [id number] Date of Birth: Jan 18, 1917  Today's Date: 05/30/2012 Time: 7829-5621 SLP Time Calculation (min): 23 min  Past Medical History:  Past Medical History  Diagnosis Date  . Heart failure    Past Surgical History: History reviewed. No pertinent past surgical history. HPI:  Hard of hearing, past medical history of heart failure probable nonischemic according to family (they relates she has mitral valve insufficiency). That comes in for altered mental status since Saturday. The patient is new to [facility] she was just transfer from a skilled nursing facility  from out of town, 1 week prior to admission. Her pain medication was changed from fentanyl patch morphine on the same day, 4 days prior to admission and by the next day she started getting slightly confused progressively getting worst. Eating and drinking less to the point where she was not even able to pick up a spoon to feed herself. Previously to this the family relates she was a very active and very jovial person. So on the day of admission she was not able to communicate and slumped over on her side in the bed so they decided to bring her to [facility] for further evaluation. Pts family reports some coughing with POs.    Assessment / Plan / Recommendation Clinical Impression  Pt presents with normal oral and oropharyngeal function with no evidence of aspiration. Pt may initiate a regular diet and thin liquids. No SLP f/u needed at this time. Will sign off.     Aspiration Risk  None    Diet Recommendation Regular;Thin liquid   Liquid Administration via: Cup;Straw Medication Administration: Whole meds with liquid Supervision: Patient able to self feed Postural Changes and/or Swallow Maneuvers: Seated upright 90 degrees    Other  Recommendations Oral Care Recommendations: Oral care BID   Follow Up Recommendations  None    Frequency and Duration         Pertinent Vitals/Pain NA    SLP Swallow Goals     Swallow Study Prior Functional Status  Type of Home: Assisted living [facility])    General HPI: Hard of hearing, past medical history of heart failure probable nonischemic according to family (they relates she has mitral valve insufficiency). That comes in for altered mental status since Saturday. The patient is new to [facility] she was just transfer from a skilled nursing facility  from out of town, 1 week prior to admission. Her pain medication was changed from fentanyl patch morphine on the same day, 4 days prior to admission and by the next day she started getting slightly confused progressively getting worst. Eating and drinking less to the point where she was not even able to pick up a spoon to feed herself. Previously to this the family relates she was a very active and very jovial person. So on the day of admission she was not able to communicate and slumped over on her side in the bed so they decided to bring her to [facility] for further evaluation. Pts family reports some coughing with POs.  Type of Study: Bedside swallow evaluation Diet Prior to this Study: NPO Temperature Spikes Noted: No Respiratory Status: Room air History of Recent Intubation: No Behavior/Cognition: Alert;Cooperative;Pleasant mood Oral Cavity - Dentition: Dentures, top;Dentures, bottom Self-Feeding Abilities: Able to feed self Baseline Vocal Quality: Clear Volitional Cough: Strong Volitional Swallow: Able to elicit    Oral/Motor/Sensory Function Overall Oral Motor/Sensory Function: Appears within functional limits for tasks assessed  Ice Chips     Thin Liquid Thin Liquid: Within functional limits Presentation: Cup;Straw    Nectar Thick Nectar Thick Liquid: Not tested   Honey Thick Honey Thick Liquid: Not tested   Puree Puree: Within functional limits   Solid   GO    Solid: Within functional limits      [facility], MA  CCC-SLP [phone]  Jerline Linzy, Riley Nearing 05/30/2012,3:18 PM

## 2012-05-31 DIAGNOSIS — I059 Rheumatic mitral valve disease, unspecified: Secondary | ICD-10-CM

## 2012-05-31 LAB — FOLATE RBC: RBC Folate: 429 ng/mL (ref >=366)

## 2012-05-31 LAB — BASIC METABOLIC PANEL
BUN: 35 mg/dL — ABNORMAL HIGH (ref 6–23)
Calcium: 8.2 mg/dL — ABNORMAL LOW (ref 8.4–10.5)
GFR calc non Af Amer: 25 mL/min — ABNORMAL LOW (ref >90)
Glucose, Bld: 87 mg/dL (ref 70–99)

## 2012-05-31 MED ORDER — FENTANYL 25 MCG/HR TD PT72: 1.0000 | MEDICATED_PATCH | TRANSDERMAL | Status: DC

## 2012-05-31 MED ORDER — CLONAZEPAM 0.5 MG PO TABS: 0.5000 mg | ORAL_TABLET | Freq: Every day | ORAL | Status: DC

## 2012-05-31 MED ORDER — POTASSIUM CHLORIDE CRYS ER 20 MEQ PO TBCR: 20.0000 meq | EXTENDED_RELEASE_TABLET | Freq: Two times a day (BID) | ORAL | Status: DC

## 2012-05-31 MED ORDER — CYANOCOBALAMIN 1000 MCG/ML IJ SOLN
1000.0000 ug | Freq: Every day | INTRAMUSCULAR | Status: DC
  Filled 2012-05-31: qty 1

## 2012-05-31 MED ORDER — WHITE PETROLATUM GEL
Status: AC
  Filled 2012-05-31: qty 5

## 2012-05-31 MED ORDER — FUROSEMIDE 20 MG PO TABS: 20.0000 mg | ORAL_TABLET | Freq: Two times a day (BID) | ORAL | Status: DC

## 2012-05-31 MED ORDER — CYANOCOBALAMIN 1000 MCG/ML IJ SOLN: INTRAMUSCULAR | Status: DC

## 2012-05-31 NOTE — Progress Notes (Signed)
1215 Report called to nurse Donna,LPN at [facility]. Patient skin with scattered bruises to lower extremities and sacral red using EPC crream. Left floor in wheelchair acompanied by staff and grand-daughter also grand daughter will be transporting patient via car.

## 2012-05-31 NOTE — Progress Notes (Signed)
1200 Foley catheter discontinued per order.

## 2012-05-31 NOTE — Progress Notes (Signed)
  Echocardiogram 2D Echocardiogram has been performed.  Marthella Osorno, [facility] 05/31/2012, 11:16 AM

## 2012-05-31 NOTE — Discharge Summary (Signed)
Physician Discharge Summary  Prairie Stenberg RUE:[id number] DOB: 09/08/1916 DOA: 05/29/2012  PCP: No primary provider on file.  Admit date: 05/29/2012 Discharge date: 05/31/2012  Time spent: 30 minutes  Recommendations for Outpatient Follow-up:  1. Follow up with PCP  Discharge Diagnoses:  Principal Problem:  *Metabolic encephalopathy Active Problems:  Acute kidney injury  Hyperkalemia  Metabolic acidosis  Protein malnutrition  Hypotension  Sinus tachycardia   Discharge Condition: stable  Diet recommendation: regular  Filed Weights   05/29/12 1840  Weight: 62.6 kg (138 lb 0.1 oz)    History of present illness:  79 y.o. female  Hard of hearing, past medical history of heart failure probable nonischemic according to family (they relates she has mitral valve insufficiency). That comes in for altered mental status since Saturday. The patient is [facility] she was just transfer from a skilled nursing facility from out of town, 1 week prior to admission. Her pain medication was changed from fentanyl patch morphine on the same day, 4 days prior to admission and by the next day she started getting slightly confused progressively getting worst. Eating and drinking less to the point where she was not even able to pick up a spoon to feed herself. Previously to this the family relates she was a very active and very jovial person. So on the day of admission she was not able to communicate and slumped over on her side in the bed so they decided to bring her to [facility] for further evaluation. Your muscles, she was given a little bit of IV fluid and he started to wake up. A Foley was placed and she could about 600 cc of urine. A basic metabolic panel was done showed an elevated creatinine (the family relates she has never had real problems) he showed elevated potassium, an EKG was done that shows a right bundle branch block with no T wave abnormalities no peak T waves. Some were asked to admit  and further evaluate.   Hospital Course:  Metabolic encephalopathy (05/29/2012) - Multifactorial most likely secondary to narcotic use and acute kidney injury.  This resolved with hydration and holding narcotics and BZD's.. - she was on a fentanyl patch on ALF then change to morphine tablets on the same day. - repeated x-ray shows no infectious etiology.  - resume narcotics and BZD's.  - B12 borderline low repleting , Rbc folate.  - TSH WNL.  Acute kidney injury (05/29/2012) - This most likely secondary to decrease by mouth intake and ongoing Lasix use.  - Cr. Is slowly improving.  - follow up with B-met as an outpatient.  Hyperkalemia: - resolved with kayexalate and IV fluids.   Metabolic acidosis: - improved with IV fluids.  - strict I and O's.   Protein malnutrition (05/29/2012) - no sign of aspirations.  Hypotension: - resolved with IV fluids.   Sinus tachycardia: - resolved, with IV fluids.     Procedures:  none (i.e. Studies not automatically included, echos, thoracentesis, etc; not x-rays)  Consultations:  none  Discharge Exam: Filed Vitals:   05/30/12 0417 05/30/12 1320 05/30/12 2121 05/31/12 0534  BP: 109/62 108/61 116/71 144/83  Pulse: 89 95 95 93  Temp: 98.3 F (36.8 C) 99.4 F (37.4 C) 98.9 F (37.2 C) 97.7 F (36.5 C)  TempSrc: Oral Oral Oral Oral  Resp: 12 18 16 20   Height:      Weight:      SpO2:  95% 97% 97%    General: A&O x3  Cardiovascular: RRR Respiratory: good air movement CTA B/L  Discharge Instructions  Discharge Orders    Future Orders Please Complete By Expires   Diet - low sodium heart healthy      Increase activity slowly          Medication List     As of 05/31/2012  8:47 AM    TAKE these medications         acetaminophen 500 MG tablet   Commonly known as: TYLENOL   Take 1,000 mg by mouth 3 (three) times daily.      aspirin 81 MG tablet   Take 81 mg by mouth daily.      bimatoprost 0.01 % Soln   Commonly known  as: LUMIGAN   Place 1 drop into both eyes at bedtime.      cyanocobalamin 1000 MCG/ML injection   Commonly known as: (VITAMIN B-12)   Daily for 7 days then weekly for 1 month.      furosemide 20 MG tablet   Commonly known as: LASIX   Take 20 mg by mouth 2 (two) times daily.      metoCLOPramide 5 MG tablet   Commonly known as: REGLAN   Take 5 mg by mouth 4 (four) times daily -  before meals and at bedtime.      nitroGLYCERIN 0.4 MG SL tablet   Commonly known as: NITROSTAT   Place 0.4 mg under the tongue every 5 (five) minutes x 3 doses as needed. For chest pain      potassium chloride SA 20 MEQ tablet   Commonly known as: K-DUR,KLOR-CON   Take 1 tablet (20 mEq total) by mouth 2 (two) times daily.   Start taking on: 06/03/2012      ranitidine 150 MG tablet   Commonly known as: ZANTAC   Take 150 mg by mouth 2 (two) times daily.      rOPINIRole 2 MG tablet   Commonly known as: REQUIP   Take 2 mg by mouth at bedtime.          The results of significant diagnostics from this hospitalization (including imaging, microbiology, ancillary and laboratory) are listed below for reference.    Significant Diagnostic Studies: Ct Head Wo Contrast  05/29/2012  *RADIOLOGY REPORT*  Clinical Data: Altered mental status.  Lethargy.  CT HEAD WITHOUT CONTRAST  Technique:  Contiguous axial images were obtained from the base of the skull through the vertex without contrast.  Comparison: None.  Findings: There is no evidence of acute intracranial hemorrhage, mass effect, brain edema or extra-axial fluid collection.  The ventricles and subarachnoid spaces are diffusely prominent consistent with moderate atrophy. Diffuse periventricular low density is most compatible with mild chronic small vessel ischemic change.   No acute cortical infarction is seen.  The visualized paranasal sinuses are clear. The calvarium is intact.  IMPRESSION: Generalized atrophy and mild chronic small vessel ischemic changes. No  acute or reversible findings identified.   Original Report Authenticated By: Carey Bullocks, M.D.    US Renal  05/29/2012  *RADIOLOGY REPORT*  Clinical Data: Renal insufficiency  RENAL / URINARY TRACT ULTRASOUND  Technique:  Complete ultrasound exam of the kidneys and urinary bladder was performed.  Comparison: No comparison studies available.  Findings:  The right kidney measures 8.8 cm in long axis.  The left kidney measures 8.5 cm.  Right kidney shows diffuse cortical thinning with increased echogenicity of the renal cortex.  The left kidney is also with evidence of increased  echogenicity and diffuse cortical thinning.  7.1 x 5.7 cm cystic lesion with an apparent internal septation appears to arise from the interpolar region of the left kidney.  Midline imaging in the anatomic pelvis shows a decompressed bladder in this patient with a Foley catheter in place.  Impression:  Bilateral small, atrophic, echogenic kidneys, consistent with medical renal disease.  7.1 cm cystic structure adjacent to the left kidney appears to arise from the interpolar region and contain a single thin internal septation.  Imaging features would be compatible with a Bosniak II category cyst.   Original Report Authenticated By: Kennith Center, M.D.    Dg Chest Port 1 View  05/30/2012  *RADIOLOGY REPORT*  Clinical Data: Cough and wheezing.  PORTABLE CHEST - 1 VIEW  Comparison: 05/29/2012  Findings: Osteopenia with at least one remote right rib fracture. Numerous leads and wires project over the chest.  Mild tracheal deviation to the left could be positional or due to a right-sided thyroid goiter.  Cardiomegaly with atherosclerosis in the transverse aorta.  No right and no definite left pleural effusion; breast tissue projects over the left costophrenic angle on the frontal.  No pneumothorax.  No congestive failure.  Patchy right base scarring or atelectasis. Lower thoracic vertebral augmentation.  IMPRESSION: Cardiomegaly without acute  disease.   Original Report Authenticated By: Jeronimo Greaves, M.D.    Dg Chest Port 1 View  05/29/2012  *RADIOLOGY REPORT*  Clinical Data: Short of breath  PORTABLE CHEST - 1 VIEW  Comparison: None.  Findings: Moderate cardiomegaly.  Low volumes.  Linear atelectasis at the right base and left midlung zone.  Normal vascularity. Thorax is rotated to the left.  No obvious mediastinal mass effect. Status post vertebral plasty at three contiguous lower thoracic upper lumbar vertebral levels.  No Kerley B lines to suggest edema. Osteopenia.  No pneumothorax.  IMPRESSION: Cardiomegaly without edema.  Bilateral linear atelectasis.   Original Report Authenticated By: Jolaine Click, M.D.     Microbiology: Recent Results (from the past 240 hour(s))  URINE CULTURE     Status: Normal   Collection Time   05/29/12 12:40 PM      Component Value Range Status Comment   Specimen Description URINE, CATHETERIZED   Final    Special Requests NONE   Final    Culture  Setup Time 05/29/2012 13:51   Final    Colony Count NO GROWTH   Final    Culture NO GROWTH   Final    Report Status 05/30/2012 FINAL   Final   CULTURE, BLOOD (ROUTINE X 2)     Status: Normal (Preliminary result)   Collection Time   05/29/12  1:20 PM      Component Value Range Status Comment   Specimen Description BLOOD ARM RIGHT   Final    Special Requests BOTTLES DRAWN AEROBIC ONLY 5CC   Final    Culture  Setup Time 05/29/2012 17:04   Final    Culture     Final    Value:        BLOOD CULTURE RECEIVED NO GROWTH TO DATE CULTURE WILL BE HELD FOR 5 DAYS BEFORE ISSUING A FINAL NEGATIVE REPORT   Report Status PENDING   Incomplete   CULTURE, BLOOD (ROUTINE X 2)     Status: Normal (Preliminary result)   Collection Time   05/29/12  1:25 PM      Component Value Range Status Comment   Specimen Description BLOOD HAND RIGHT   Final  Special Requests BOTTLES DRAWN AEROBIC ONLY 5CC   Final    Culture  Setup Time 05/29/2012 17:04   Final    Culture     Final     Value:        BLOOD CULTURE RECEIVED NO GROWTH TO DATE CULTURE WILL BE HELD FOR 5 DAYS BEFORE ISSUING A FINAL NEGATIVE REPORT   Report Status PENDING   Incomplete      Labs: Basic Metabolic Panel:  Lab 05/30/12 7829 05/29/12 1936 05/29/12 1312 05/29/12 1307  NA 139 -- -- 134*  K 4.8 -- -- 6.2*  CL 108 -- -- 101  CO2 17* -- -- 16*  GLUCOSE 84 -- -- 90  BUN 46* -- -- 59*  CREATININE 2.16* 2.57* -- 2.89*  CALCIUM 8.4 8.6 -- 8.8  MG -- 1.9 2.1 --  PHOS -- -- -- --   Liver Function Tests:  Lab 05/30/12 0730 05/29/12 1307  AST 14 22  ALT 7 8  ALKPHOS 55 66  BILITOT 0.2* 0.2*  PROT 6.4 7.3  ALBUMIN 2.9* 3.4*   No results found for this basename: LIPASE:5,AMYLASE:5 in the last 168 hours No results found for this basename: AMMONIA:5 in the last 168 hours CBC:  Lab 05/30/12 0730 05/29/12 1936 05/29/12 1307  WBC 6.0 8.0 7.0  NEUTROABS -- -- --  HGB 8.2* 8.1* 9.1*  HCT 25.2* 24.8* 28.4*  MCV 96.2 95.0 95.9  PLT 298 309 296   Cardiac Enzymes:  Lab 05/30/12 0730 05/30/12 0021 05/29/12 1936 05/29/12 1308  CKTOTAL -- -- 513* --  CKMB -- -- -- --  CKMBINDEX -- -- -- --  TROPONINI <0.30 <0.30 <0.30 <0.30   BNP: BNP (last 3 results) No results found for this basename: PROBNP:3 in the last 8760 hours CBG:  Lab 05/31/12 0755 05/30/12 2127 05/30/12 1648 05/30/12 1305 05/30/12 0804  GLUCAP 89 96 100* 89 74     Signed:  FELIZ ORTIZ, Willamina Grieshop  [facility] 05/31/2012, 8:47 AM

## 2012-05-31 NOTE — Clinical Social Work Note (Signed)
CSW was consulted to complete discharge of patient. Pt to transfer to [facility] today via granddaughter in law, Pam. Facility and family are aware of d/c. D/C packet complete with chart copy, signed FL2, and signed hard Rx.  CSW signing off as no other CSW needs identified at this time.  Lia Foyer, LCSWA [facility] Clinical Social Worker Contact #: [phone]

## 2012-05-31 NOTE — Evaluation (Signed)
Occupational Therapy Evaluation and Discharge Patient Details Name: Candice Barker MRN: [id number] DOB: 1916-12-31 Today's Date: 05/31/2012 Time: 4098-1191 OT Time Calculation (min): 60 min  OT Assessment / Plan / Recommendation Clinical Impression  This 79 yo female admitted with altered mental status/metabolic encephalopathy and very HOH (speak in left ear) presents to acute OT with problems below. Would benefit from a trial of OT at ALF. Acute OT will sign off due to pt being D/C'd today.     OT Assessment  Patient needs continued OT Services    Follow Up Recommendations  Home health OT (at ALF)    Barriers to Discharge None    Equipment Recommendations  None recommended by OT          Precautions / Restrictions Precautions Precautions: Fall Precaution Comments: fragile skin, gags when takes dentures out Restrictions Weight Bearing Restrictions: No       ADL  Eating/Feeding: Performed;Set up Where Assessed - Eating/Feeding: Bed level Grooming: Performed;Wash/dry face;Set up;Supervision/safety Where Assessed - Grooming: Unsupported sitting Upper Body Bathing: Simulated;Minimal assistance Where Assessed - Upper Body Bathing: Unsupported sitting Lower Body Bathing: Simulated;+1 Total assistance Where Assessed - Lower Body Bathing: Supported sit to stand Upper Body Dressing: Simulated;+1 Total assistance Where Assessed - Upper Body Dressing: Unsupported sitting Lower Body Dressing: Simulated;+1 Total assistance Where Assessed - Lower Body Dressing: Supported sit to stand Toilet Transfer: Mining engineer Method: Sit to Barista:  (Bed, 5 feet to recliner, back to bed) Toileting - Clothing Manipulation and Hygiene: Performed;+1 Total assistance (pt mildly incontient of stool upon standing) Where Assessed - Toileting Clothing Manipulation and Hygiene: Standing Equipment Used: Rolling walker Transfers/Ambulation Related to  ADLs: Min A for  all ADL Comments: Grand daughter was asking about getting pt's teeth and mouth cleaned post breakfast. I gathered items that pt would need, asked her to take out her dentures (at which point she gagged herself and threw up all of her breakfast)----worked on helping her get cleaned up since her NT was a female and she did not want a female helping her.    OT Diagnosis: Generalized weakness  OT Problem List: Decreased strength;Decreased range of motion;Decreased activity tolerance;Impaired balance (sitting and/or standing);Impaired UE functional use OT Treatment Interventions:       Visit Information  Last OT Received On: 05/31/12 Assistance Needed: +1    Subjective Data  Subjective: You have to speak in my left ear, that is the only one I can hear anything out of Patient Stated Goal: Go back home (ALF)   Prior Functioning     Home Living Type of Home: Assisted living [facility]) Prior Function Level of Independence: Needs assistance Needs Assistance: Transfers;Gait;Meal Prep;Light Housekeeping;Bathing;Dressing;Feeding;Grooming;Toileting Bath: Maximal Dressing: Total Feeding: Supervision/set-up Grooming: Moderate Toileting: Moderate Meal Prep: Total Light Housekeeping: Total Gait Assistance: +1 A with short distances (per grand-daughter 5 feet max) Transfer Assistance: +1 A Able to Take Stairs?: No Driving: No Vocation: Retired Musician: HOH (speak in left ear) Dominant Hand: Right            Cognition  Cognition Overall Cognitive Status: Difficult to assess Difficult to assess due to: Hard of hearing/deaf Arousal/Alertness: Awake/alert Orientation Level: Appears intact for tasks assessed Behavior During Session: [facility] for tasks performed    Extremity/Trunk Assessment Right Upper Extremity Assessment RUE ROM/Strength/Tone: Deficits RUE ROM/Strength/Tone Deficits: OA Left Upper Extremity Assessment LUE ROM/Strength/Tone:  Deficits LUE ROM/Strength/Tone Deficits: OA     Mobility Bed Mobility Bed Mobility: Rolling Right;Rolling  Left;Supine to Sit;Sitting - Scoot to [place] of Bed;Sit to Supine Rolling Right: 3: Mod assist;With rail Rolling Left: 3: Mod assist;With rail Supine to Sit: 3: Mod assist;With rails;HOB elevated (30 degrees) Sitting - Scoot to Edge of Bed: 3: Mod assist;With rail Sit to Supine: 1: +1 Total assist;HOB flat Transfers Transfers: Sit to Stand;Stand to Sit Sit to Stand: 4: Min assist;With upper extremity assist;From bed Stand to Sit: 4: Min assist;With upper extremity assist;With armrests;To chair/3-in-1 Details for Transfer Assistance: Max VC's and tactile cues for safe hand placement        Balance Static Sitting Balance Static Sitting - Balance Support: No upper extremity supported;Feet supported Static Sitting - Level of Assistance:  (min guard A) Static Sitting - Comment/# of Minutes: 8 minutes   End of Session OT - End of Session Equipment Utilized During Treatment:  (RW) Activity Tolerance: Patient tolerated treatment well Patient left: in bed;with call bell/phone within reach;with family/visitor present       Evette Georges [phone] 05/31/2012, 11:26 AM

## 2012-05-31 NOTE — Progress Notes (Signed)
[address] Pam Sofie Hartigan grand daughter has all of patient personal belongings.

## 2012-06-01 ENCOUNTER — Telehealth (HOSPITAL_COMMUNITY): Payer: Self-pay | Admitting: Emergency Medicine

## 2012-06-02 LAB — CULTURE, BLOOD (ROUTINE X 2)

## 2012-06-04 LAB — CULTURE, BLOOD (ROUTINE X 2): Culture: NO GROWTH

## 2013-10-07 ENCOUNTER — Encounter (HOSPITAL_COMMUNITY): Payer: Self-pay | Admitting: Emergency Medicine

## 2013-10-07 ENCOUNTER — Inpatient Hospital Stay (HOSPITAL_COMMUNITY)
Admission: EM | Admit: 2013-10-07 | Discharge: 2013-10-11 | DRG: 291 | Disposition: A | Discharge status: Skilled Nursing Facility | Payer: Medicare Other | Attending: Internal Medicine | Admitting: Internal Medicine

## 2013-10-07 ENCOUNTER — Emergency Department (HOSPITAL_COMMUNITY): Payer: Medicare Other

## 2013-10-07 DIAGNOSIS — I359 Nonrheumatic aortic valve disorder, unspecified: Secondary | ICD-10-CM | POA: Diagnosis present

## 2013-10-07 DIAGNOSIS — K219 Gastro-esophageal reflux disease without esophagitis: Secondary | ICD-10-CM | POA: Diagnosis present

## 2013-10-07 DIAGNOSIS — I428 Other cardiomyopathies: Secondary | ICD-10-CM | POA: Diagnosis present

## 2013-10-07 DIAGNOSIS — I509 Heart failure, unspecified: Secondary | ICD-10-CM | POA: Diagnosis not present

## 2013-10-07 DIAGNOSIS — E871 Hypo-osmolality and hyponatremia: Secondary | ICD-10-CM | POA: Diagnosis present

## 2013-10-07 DIAGNOSIS — H409 Unspecified glaucoma: Secondary | ICD-10-CM | POA: Diagnosis present

## 2013-10-07 DIAGNOSIS — I519 Heart disease, unspecified: Secondary | ICD-10-CM

## 2013-10-07 DIAGNOSIS — N183 Chronic kidney disease, stage 3 unspecified: Secondary | ICD-10-CM

## 2013-10-07 DIAGNOSIS — N184 Chronic kidney disease, stage 4 (severe): Secondary | ICD-10-CM | POA: Diagnosis present

## 2013-10-07 DIAGNOSIS — Z9089 Acquired absence of other organs: Secondary | ICD-10-CM

## 2013-10-07 DIAGNOSIS — I5041 Acute combined systolic (congestive) and diastolic (congestive) heart failure: Secondary | ICD-10-CM

## 2013-10-07 DIAGNOSIS — T502X5A Adverse effect of carbonic-anhydrase inhibitors, benzothiadiazides and other diuretics, initial encounter: Secondary | ICD-10-CM | POA: Diagnosis not present

## 2013-10-07 DIAGNOSIS — N189 Chronic kidney disease, unspecified: Secondary | ICD-10-CM

## 2013-10-07 DIAGNOSIS — Z7982 Long term (current) use of aspirin: Secondary | ICD-10-CM

## 2013-10-07 DIAGNOSIS — I272 Pulmonary hypertension, unspecified: Secondary | ICD-10-CM

## 2013-10-07 DIAGNOSIS — D649 Anemia, unspecified: Secondary | ICD-10-CM | POA: Diagnosis present

## 2013-10-07 DIAGNOSIS — N179 Acute kidney failure, unspecified: Secondary | ICD-10-CM | POA: Diagnosis not present

## 2013-10-07 DIAGNOSIS — I129 Hypertensive chronic kidney disease with stage 1 through stage 4 chronic kidney disease, or unspecified chronic kidney disease: Secondary | ICD-10-CM | POA: Diagnosis present

## 2013-10-07 DIAGNOSIS — F411 Generalized anxiety disorder: Secondary | ICD-10-CM | POA: Diagnosis present

## 2013-10-07 DIAGNOSIS — I5031 Acute diastolic (congestive) heart failure: Principal | ICD-10-CM | POA: Diagnosis present

## 2013-10-07 DIAGNOSIS — D638 Anemia in other chronic diseases classified elsewhere: Secondary | ICD-10-CM | POA: Diagnosis present

## 2013-10-07 DIAGNOSIS — Z66 Do not resuscitate: Secondary | ICD-10-CM | POA: Diagnosis not present

## 2013-10-07 DIAGNOSIS — M4800 Spinal stenosis, site unspecified: Secondary | ICD-10-CM

## 2013-10-07 DIAGNOSIS — IMO0002 Reserved for concepts with insufficient information to code with codable children: Secondary | ICD-10-CM | POA: Diagnosis not present

## 2013-10-07 DIAGNOSIS — R4181 Age-related cognitive decline: Secondary | ICD-10-CM | POA: Diagnosis present

## 2013-10-07 DIAGNOSIS — Z515 Encounter for palliative care: Secondary | ICD-10-CM

## 2013-10-07 DIAGNOSIS — R0600 Dyspnea, unspecified: Secondary | ICD-10-CM

## 2013-10-07 DIAGNOSIS — Z79899 Other long term (current) drug therapy: Secondary | ICD-10-CM

## 2013-10-07 DIAGNOSIS — J189 Pneumonia, unspecified organism: Secondary | ICD-10-CM | POA: Diagnosis present

## 2013-10-07 DIAGNOSIS — E119 Type 2 diabetes mellitus without complications: Secondary | ICD-10-CM | POA: Diagnosis present

## 2013-10-07 DIAGNOSIS — E46 Unspecified protein-calorie malnutrition: Secondary | ICD-10-CM | POA: Diagnosis present

## 2013-10-07 DIAGNOSIS — Z87891 Personal history of nicotine dependence: Secondary | ICD-10-CM

## 2013-10-07 DIAGNOSIS — R197 Diarrhea, unspecified: Secondary | ICD-10-CM | POA: Diagnosis present

## 2013-10-07 DIAGNOSIS — I959 Hypotension, unspecified: Secondary | ICD-10-CM | POA: Diagnosis not present

## 2013-10-07 DIAGNOSIS — I350 Nonrheumatic aortic (valve) stenosis: Secondary | ICD-10-CM

## 2013-10-07 DIAGNOSIS — I429 Cardiomyopathy, unspecified: Secondary | ICD-10-CM

## 2013-10-07 HISTORY — DX: Spinal stenosis, site unspecified: M48.00

## 2013-10-07 HISTORY — DX: Endocarditis, valve unspecified: I38

## 2013-10-07 HISTORY — DX: Cardiomyopathy, unspecified: I42.9

## 2013-10-07 HISTORY — DX: Depression, unspecified: F32.A

## 2013-10-07 HISTORY — DX: Bronchitis, not specified as acute or chronic: J40

## 2013-10-07 HISTORY — DX: Unspecified glaucoma: H40.9

## 2013-10-07 HISTORY — DX: Major depressive disorder, single episode, unspecified: F32.9

## 2013-10-07 HISTORY — DX: Gastro-esophageal reflux disease without esophagitis: K21.9

## 2013-10-07 HISTORY — DX: Type 2 diabetes mellitus without complications: E11.9

## 2013-10-07 HISTORY — DX: Edema, unspecified: R60.9

## 2013-10-07 LAB — COMPREHENSIVE METABOLIC PANEL
ALT: 7 U/L (ref 0–35)
AST: 12 U/L (ref 0–37)
Albumin: 4 g/dL (ref 3.5–5.2)
Alkaline Phosphatase: 62 U/L (ref 39–117)
BILIRUBIN TOTAL: 0.2 mg/dL — AB (ref 0.3–1.2)
BUN: 38 mg/dL — ABNORMAL HIGH (ref 6–23)
CHLORIDE: 97 meq/L (ref 96–112)
CO2: 19 mEq/L (ref 19–32)
Calcium: 8.6 mg/dL (ref 8.4–10.5)
Creatinine, Ser: 1.96 mg/dL — ABNORMAL HIGH (ref 0.50–1.10)
GFR calc Af Amer: 24 mL/min — ABNORMAL LOW (ref >90)
GFR, EST NON AFRICAN AMERICAN: 21 mL/min — AB (ref >90)
Glucose, Bld: 93 mg/dL (ref 70–99)
Potassium: 4.8 mEq/L (ref 3.7–5.3)
SODIUM: 134 meq/L — AB (ref 137–147)
Total Protein: 7.3 g/dL (ref 6.0–8.3)

## 2013-10-07 LAB — URINALYSIS, ROUTINE W REFLEX MICROSCOPIC
Bilirubin Urine: NEGATIVE
Bilirubin Urine: NEGATIVE
GLUCOSE, UA: NEGATIVE mg/dL
Glucose, UA: NEGATIVE mg/dL
HGB URINE DIPSTICK: NEGATIVE
Ketones, ur: NEGATIVE mg/dL
Ketones, ur: NEGATIVE mg/dL
Leukocytes, UA: NEGATIVE
NITRITE: NEGATIVE
Nitrite: NEGATIVE
Protein, ur: NEGATIVE mg/dL
Protein, ur: NEGATIVE mg/dL
SPECIFIC GRAVITY, URINE: 1.005 (ref 1.005–1.030)
SPECIFIC GRAVITY, URINE: 1.006 (ref 1.005–1.030)
UROBILINOGEN UA: 0.2 mg/dL (ref 0.0–1.0)
UROBILINOGEN UA: 0.2 mg/dL (ref 0.0–1.0)
pH: 5.5 (ref 5.0–8.0)
pH: 5.5 (ref 5.0–8.0)

## 2013-10-07 LAB — PRO B NATRIURETIC PEPTIDE: Pro B Natriuretic peptide (BNP): 44135 pg/mL — ABNORMAL HIGH (ref 0–450)

## 2013-10-07 LAB — CBC WITH DIFFERENTIAL/PLATELET
BASOS ABS: 0 10*3/uL (ref 0.0–0.1)
Basophils Relative: 1 % (ref 0–1)
Eosinophils Absolute: 0 10*3/uL (ref 0.0–0.7)
Eosinophils Relative: 1 % (ref 0–5)
HCT: 26.6 % — ABNORMAL LOW (ref 36.0–46.0)
Hemoglobin: 8.6 g/dL — ABNORMAL LOW (ref 12.0–15.0)
LYMPHS PCT: 10 % — AB (ref 12–46)
Lymphs Abs: 0.7 10*3/uL (ref 0.7–4.0)
MCH: 30.1 pg (ref 26.0–34.0)
MCHC: 32.3 g/dL (ref 30.0–36.0)
MCV: 93 fL (ref 78.0–100.0)
Monocytes Absolute: 0.5 10*3/uL (ref 0.1–1.0)
Monocytes Relative: 7 % (ref 3–12)
NEUTROS ABS: 5.9 10*3/uL (ref 1.7–7.7)
NEUTROS PCT: 81 % — AB (ref 43–77)
PLATELETS: 207 10*3/uL (ref 150–400)
RBC: 2.86 MIL/uL — ABNORMAL LOW (ref 3.87–5.11)
RDW: 13.8 % (ref 11.5–15.5)
WBC: 7.2 10*3/uL (ref 4.0–10.5)

## 2013-10-07 LAB — I-STAT TROPONIN, ED: Troponin i, poc: 0.16 ng/mL (ref 0.00–0.08)

## 2013-10-07 LAB — TROPONIN I: Troponin I: <0.3 ng/mL (ref <0.30)

## 2013-10-07 LAB — URINE MICROSCOPIC-ADD ON

## 2013-10-07 LAB — MRSA PCR SCREENING: MRSA by PCR: NEGATIVE

## 2013-10-07 LAB — I-STAT CG4 LACTIC ACID, ED: LACTIC ACID, VENOUS: 1.26 mmol/L (ref 0.5–2.2)

## 2013-10-07 MED ORDER — PANTOPRAZOLE SODIUM 40 MG PO TBEC
40.0000 mg | DELAYED_RELEASE_TABLET | Freq: Every day | ORAL | Status: DC
  Administered 2013-10-08 – 2013-10-11 (×4): 40 mg via ORAL
  Filled 2013-10-07 (×4): qty 1

## 2013-10-07 MED ORDER — ASPIRIN 81 MG PO CHEW
81.0000 mg | CHEWABLE_TABLET | Freq: Every day | ORAL | Status: DC
  Administered 2013-10-08 – 2013-10-11 (×4): 81 mg via ORAL
  Filled 2013-10-07 (×4): qty 1

## 2013-10-07 MED ORDER — FUROSEMIDE 10 MG/ML IJ SOLN
60.0000 mg | Freq: Once | INTRAMUSCULAR | Status: AC
  Administered 2013-10-07: 60 mg via INTRAVENOUS
  Filled 2013-10-07: qty 8

## 2013-10-07 MED ORDER — FENTANYL 25 MCG/HR TD PT72
25.0000 ug | MEDICATED_PATCH | TRANSDERMAL | Status: DC
  Administered 2013-10-10: 25 ug via TRANSDERMAL
  Filled 2013-10-07: qty 1

## 2013-10-07 MED ORDER — RISAQUAD PO CAPS
1.0000 | ORAL_CAPSULE | Freq: Every day | ORAL | Status: DC
  Administered 2013-10-08 – 2013-10-11 (×4): 1 via ORAL
  Filled 2013-10-07 (×4): qty 1

## 2013-10-07 MED ORDER — ENOXAPARIN SODIUM 40 MG/0.4ML ~~LOC~~ SOLN
40.0000 mg | SUBCUTANEOUS | Status: DC
  Administered 2013-10-07: 40 mg via SUBCUTANEOUS
  Filled 2013-10-07 (×2): qty 0.4

## 2013-10-07 MED ORDER — FUROSEMIDE 10 MG/ML IJ SOLN
40.0000 mg | Freq: Two times a day (BID) | INTRAMUSCULAR | Status: DC
  Administered 2013-10-07: 40 mg via INTRAVENOUS
  Filled 2013-10-07 (×3): qty 4

## 2013-10-07 MED ORDER — SODIUM CHLORIDE 0.9 % IV SOLN: 250.0000 mL | INTRAVENOUS | Status: DC | PRN

## 2013-10-07 MED ORDER — SODIUM CHLORIDE 0.9 % IJ SOLN
3.0000 mL | Freq: Two times a day (BID) | INTRAMUSCULAR | Status: DC
  Administered 2013-10-08 – 2013-10-11 (×6): 3 mL via INTRAVENOUS

## 2013-10-07 MED ORDER — DIPHENHYDRAMINE HCL 50 MG/ML IJ SOLN
12.5000 mg | Freq: Once | INTRAMUSCULAR | Status: AC
  Administered 2013-10-07: 12.5 mg via INTRAVENOUS
  Filled 2013-10-07: qty 1

## 2013-10-07 MED ORDER — ROPINIROLE HCL 1 MG PO TABS
1.0000 mg | ORAL_TABLET | Freq: Every day | ORAL | Status: DC
  Administered 2013-10-07 – 2013-10-10 (×4): 1 mg via ORAL
  Filled 2013-10-07 (×5): qty 1

## 2013-10-07 MED ORDER — BIOTENE DRY MOUTH MT LIQD
15.0000 mL | Freq: Two times a day (BID) | OROMUCOSAL | Status: DC
  Administered 2013-10-07 – 2013-10-11 (×8): 15 mL via OROMUCOSAL

## 2013-10-07 MED ORDER — SODIUM CHLORIDE 0.9 % IJ SOLN: 3.0000 mL | INTRAMUSCULAR | Status: DC | PRN

## 2013-10-07 MED ORDER — LATANOPROST 0.005 % OP SOLN
1.0000 [drp] | Freq: Every day | OPHTHALMIC | Status: DC
  Administered 2013-10-07 – 2013-10-10 (×4): 1 [drp] via OPHTHALMIC
  Filled 2013-10-07: qty 2.5

## 2013-10-07 MED ORDER — TRAZODONE HCL 50 MG PO TABS
50.0000 mg | ORAL_TABLET | Freq: Every day | ORAL | Status: DC
  Administered 2013-10-07 – 2013-10-10 (×4): 50 mg via ORAL
  Filled 2013-10-07 (×5): qty 1

## 2013-10-07 MED ORDER — LEVOFLOXACIN IN D5W 500 MG/100ML IV SOLN
500.0000 mg | INTRAVENOUS | Status: DC
  Administered 2013-10-07: 500 mg via INTRAVENOUS
  Filled 2013-10-07 (×2): qty 100

## 2013-10-07 MED ORDER — NITROGLYCERIN 0.4 MG SL SUBL: 0.4000 mg | SUBLINGUAL_TABLET | SUBLINGUAL | Status: DC | PRN

## 2013-10-07 NOTE — Progress Notes (Signed)
  CARE MANAGEMENT ED NOTE 10/07/2013  Patient:  Candice Barker, Candice Barker   Account Number:  [id number]  Date Initiated:  10/07/2013  Documentation initiated by:  Edd Arbour  Subjective/Objective Assessment:   80 yr old medicare from [place] [facility] resident on antibiotics for 3 weeks and 2 days ago, developed itching. Patient's grand daughter reported that the patient has had hemoptysis and SOB with activity as well.     Subjective/Objective Assessment Detail:   No pcp listed in EPIC pcp listed on snf information sheet is Dr Tilda Franco in [facility] and pcp listed on snf medication list is Dr Florentina Jenny     Action/Plan:   EPIC updated   Action/Plan Detail:   Anticipated DC Date:  10/10/2013     Status Recommendation to Physician:   Result of Recommendation:    Other ED Services  Consult Working Plan    DC Planning Services  Outpatient Services - Pt will follow up  Other  PCP issues    Choice offered to / List presented to:            Status of service:  Completed, signed off  ED Comments:   ED Comments Detail:

## 2013-10-07 NOTE — H&P (Signed)
[facility] History and Physical  Candice Barker ZOX:[id number] DOB: 11/23/1916 DOA: 10/07/2013  Referring physician: EDP PCP: Florentina JennyRIPP, HENRY, MD   Chief Complaint:  Sob, since 2 weeks.   HPI: Candice Barker is a 80 y.o. female with prior h/o hypertension, CHF unknown EF, recently diagnosed with pneumonia, was started on Levaquin and later on switched to augmentin for persistent symptoms was brought in from ALF by her grand daughter. On further questioning, pt reports she cannot lay flat and has sob and thought she had pneumonia. She reports sob on exertion, and cough with mild productive sputum. She also reports seeing scanty pink speckled sputum occasionally. She denies fevers, has cold clammy extremities everyday. She denies syncope or palpitations. She denies dizziness. On arrival to ED, she underwent CXR revealing CHF picture. Her pro bnp is elevated and lab work reveal mild hyponatremia and baseline renal function of CKD stage 3. Her UA is clear of infection. She is referred to medical service for admission for possible CHF exacerbation. She also reports pedal edema, puffiness of the face and abdominal distention.     Review of Systems:  See HPI otherwise negative.    Past Medical History  Diagnosis Date  . Heart failure   . Spinal stenosis   . Edema   . Glaucoma   . Diabetes mellitus without complication   . GERD (gastroesophageal reflux disease)   . Depression   . Bronchitis   . Endocarditis    Past Surgical History  Procedure Laterality Date  . Appendectomy    . Knee surgery    . Bilateral catract extraction     Social History:  reports that she has quit smoking. Her smoking use included Cigarettes. She smoked 1.00 pack per day. She has never used smokeless tobacco. She reports that she does not drink alcohol or use illicit drugs.  No Known Allergies  Family History  Problem Relation Age of Onset  . Other Mother   . Other Father      Prior to Admission  medications   Medication Sig Start Date End Date Taking? Authorizing Provider  acetaminophen (TYLENOL) 500 MG tablet Take 1,000 mg by mouth 3 (three) times daily.   Yes Historical Provider, MD  acidophilus (RISAQUAD) CAPS capsule Take 1 capsule by mouth daily.   Yes Historical Provider, MD  aspirin 81 MG tablet Take 81 mg by mouth daily.   Yes Historical Provider, MD  calcium carbonate (TUMS - DOSED IN MG ELEMENTAL CALCIUM) 500 MG chewable tablet Chew 1 tablet by mouth every 4 (four) hours as needed for indigestion or heartburn.   Yes Historical Provider, MD  fentaNYL (DURAGESIC) 25 MCG/HR Place 1 patch (25 mcg total) onto the skin every 3 (three) days. 05/31/12  Yes Marinda ElkAbraham Feliz Ortiz, MD  furosemide (LASIX) 20 MG tablet Take 20 mg by mouth daily. 06/03/12  Yes Marinda ElkAbraham Feliz Ortiz, MD  latanoprost (XALATAN) 0.005 % ophthalmic solution Place 1 drop into both eyes at bedtime.   Yes Historical Provider, MD  nitroGLYCERIN (NITROSTAT) 0.4 MG SL tablet Place 0.4 mg under the tongue every 5 (five) minutes x 3 doses as needed. For chest pain   Yes Historical Provider, MD  nystatin (MYCOSTATIN/NYSTOP) 100000 UNIT/GM POWD Apply 15 g topically every evening.   Yes Historical Provider, MD  omeprazole (PRILOSEC) 20 MG capsule Take 20 mg by mouth daily.   Yes Historical Provider, MD  oxycodone-acetaminophen (PERCOCET) 2.5-325 MG per tablet Take 1 tablet by mouth every 4 (four) hours as needed  for pain.   Yes Historical Provider, MD  polyethylene glycol (MIRALAX / GLYCOLAX) packet Take 17 g by mouth daily as needed for moderate constipation.   Yes Historical Provider, MD  psyllium (REGULOID) 0.52 G capsule Take 1.04 g by mouth at bedtime.   Yes Historical Provider, MD  rOPINIRole (REQUIP) 1 MG tablet Take 1 mg by mouth at bedtime.   Yes Historical Provider, MD  senna-docusate (SENOKOT-S) 8.6-50 MG per tablet Take 1 tablet by mouth at bedtime.   Yes Historical Provider, MD  Sodium Polystyrene Sulfonate (KALEXATE)  POWD Take 15 g by mouth once a week.   Yes Historical Provider, MD  traZODone (DESYREL) 50 MG tablet Take 50 mg by mouth at bedtime.   Yes Historical Provider, MD   Physical Exam: Filed Vitals:   10/07/13 1627  BP: 143/76  Pulse:   Temp: 97.9 F (36.6 C)  Resp: 19    BP 143/76  Pulse 92  Temp(Src) 97.9 F (36.6 C) (Oral)  Resp 19  Ht 5\' 3"  (1.6 m)  Wt 61.961 kg (136 lb 9.6 oz)  BMI 24.20 kg/m2  SpO2 98%  General:  Appears calm and comfortable Eyes: PERRL, normal lids, irises & conjunctiva Cardiovascular: RRR, no m/r/g. No LE edema. Telemetry: SR, no arrhythmias  Respiratory: diminished air entry at bases and no wheezing heard. Scattered rales.  Abdomen: soft, ntnd Skin: no rash or induration seen on limited exam Musculoskeletal: grossly normal tone BUE/BLE Neurologic: grossly non-focal.          Labs on Admission:  Basic Metabolic Panel:  Recent Labs Lab 10/07/13 1418  NA 134*  K 4.8  CL 97  CO2 19  GLUCOSE 93  BUN 38*  CREATININE 1.96*  CALCIUM 8.6   Liver Function Tests:  Recent Labs Lab 10/07/13 1418  AST 12  ALT 7  ALKPHOS 62  BILITOT 0.2*  PROT 7.3  ALBUMIN 4.0   No results found for this basename: LIPASE, AMYLASE,  in the last 168 hours No results found for this basename: AMMONIA,  in the last 168 hours CBC:  Recent Labs Lab 10/07/13 1418  WBC 7.2  NEUTROABS 5.9  HGB 8.6*  HCT 26.6*  MCV 93.0  PLT 207   Cardiac Enzymes: No results found for this basename: CKTOTAL, CKMB, CKMBINDEX, TROPONINI,  in the last 168 hours  BNP (last 3 results)  Recent Labs  10/07/13 1418  PROBNP 44135.0*   CBG: No results found for this basename: GLUCAP,  in the last 168 hours  Radiological Exams on Admission: Dg Chest 2 View  10/07/2013   CLINICAL DATA:  Recently diagnosis with right lower lobe pneumonia. Persistent chest discomfort, congestion, cough and shortness of breath.  EXAM: CHEST  2 VIEW  COMPARISON:  Chest x-ray 05/30/2012.  FINDINGS:  Bibasilar opacities may reflect areas of atelectasis and/or consolidation. Probable small left pleural effusion. In addition, there is some cephalization of the pulmonary vasculature with a background of indistinct interstitial markings and some Kerley B-lines in the lower right long, indicative of a background of mild interstitial pulmonary edema. Mild cardiomegaly. The patient is rotated to the left on today's exam, resulting in distortion of the mediastinal contours and reduced diagnostic sensitivity and specificity for mediastinal pathology. Atherosclerosis in the thoracic aorta.  IMPRESSION: 1. Bibasilar opacities may reflect areas of atelectasis and/or consolidation in the lower lobes of the lungs bilaterally. 2. Mild cardiomegaly with evidence of mild interstitial pulmonary edema and small left pleural effusion. These findings suggest concurrent  congestive heart failure. 3. Atherosclerosis.   Electronically Signed   By: Trudie Reed M.D.   On: 10/07/2013 15:08    EKG: sinus rhythm with RBBB.  Assessment/Plan Active Problems:   Protein malnutrition   CHF (congestive heart failure)   CKD (chronic kidney disease), stage III   Anemia   Acute CHF exacerbation: - unknown EF,  - admit to telemetry, get serial enzymes, lasix 40 mg IV bid, EKG shows sinus rhythm and point of care troponin is 0.16. Echocardiogram ordered.  Replete K as needed. Monitor renal function on lasix. Nasal oxygen as needed. Repeat CXR as needed. Call cardiology if the next troponin comes back elevated.  Spoke to family, grandson and grand daughter, they do not want any aggressive or invasive measures. They want her to be comfortable at this time.   Resolving Pneumonia: - continue with levaquin to complete the course of the pneumonia.   CKD stage 3: - monitor on lasix.    Anemia: - appears to be secondary to chronic disease like CKD.  - monitor.   Pink speckled sputum:  - probably secondary to mild pulmonary  edema.   DVT prophylaxis.   Code Status: DNR  Family Communication: DIscussed with grand daughter at bed side  Disposition Plan: admit to telemetry.   Time spent: 80 min  [facility] Pager [phone]  **Disclaimer: This note may have been dictated with voice recognition software. Similar sounding words can inadvertently be transcribed and this note may contain transcription errors which may not have been corrected upon publication of note.**

## 2013-10-07 NOTE — ED Notes (Signed)
Bed: QB16 Expected date:  Expected time:  Means of arrival:  Comments: EMS urticaria

## 2013-10-07 NOTE — ED Provider Notes (Addendum)
CSN: [id number]     Arrival date & time 10/07/13  1257 History   First MD Initiated Contact with Patient 10/07/13 1342     Chief Complaint  Patient presents with  . Pruritis  . Shortness of Breath  . Hemoptysis     (Consider location/radiation/quality/duration/timing/severity/associated sxs/prior Treatment) Patient is a 80 y.o. female presenting with shortness of breath. The history is provided by the patient.  Shortness of Breath Severity:  Severe Onset quality:  Gradual Duration:  2 weeks Timing:  Constant Progression:  Worsening Chronicity:  New Context comment:  Treated over the last few weeks with levaquin and then augmentin now Relieved by:  Nothing Worsened by:  Coughing, activity and exertion Ineffective treatments: abx. Associated symptoms: cough, hemoptysis and sputum production   Associated symptoms: no abdominal pain, no diaphoresis, no fever and no vomiting   Associated symptoms comment:  Mild blood streaked sputum today.   Risk factors: no hx of PE/DVT, no prolonged immobilization and no tobacco use   Risk factors comment:  Hx of CHF   Past Medical History  Diagnosis Date  . Heart failure   . Spinal stenosis   . Edema   . Glaucoma   . Diabetes mellitus without complication   . GERD (gastroesophageal reflux disease)   . Depression   . Bronchitis   . Endocarditis    Past Surgical History  Procedure Laterality Date  . Appendectomy    . Knee surgery     Family History  Problem Relation Age of Onset  . Other Mother   . Other Father    History  Substance Use Topics  . Smoking status: Former Smoker -- 1.00 packs/day    Types: Cigarettes  . Smokeless tobacco: Never Used  . Alcohol Use: No   OB History   Grav Para Term Preterm Abortions TAB SAB Ect Mult Living                 Review of Systems  Constitutional: Negative for fever and diaphoresis.  Respiratory: Positive for cough, hemoptysis, sputum production and shortness of breath.    Gastrointestinal: Negative for vomiting and abdominal pain.  All other systems reviewed and are negative.     Allergies  Review of patient's allergies indicates no known allergies.  Home Medications   Prior to Admission medications   Medication Sig Start Date End Date Taking? Authorizing Provider  acetaminophen (TYLENOL) 500 MG tablet Take 1,000 mg by mouth 3 (three) times daily.   Yes Historical Provider, MD  acidophilus (RISAQUAD) CAPS capsule Take 1 capsule by mouth daily.   Yes Historical Provider, MD  aspirin 81 MG tablet Take 81 mg by mouth daily.   Yes Historical Provider, MD  calcium carbonate (TUMS - DOSED IN MG ELEMENTAL CALCIUM) 500 MG chewable tablet Chew 1 tablet by mouth every 4 (four) hours as needed for indigestion or heartburn.   Yes Historical Provider, MD  fentaNYL (DURAGESIC) 25 MCG/HR Place 1 patch (25 mcg total) onto the skin every 3 (three) days. 05/31/12  Yes Marinda Elk, MD  furosemide (LASIX) 20 MG tablet Take 20 mg by mouth daily. 06/03/12  Yes Marinda Elk, MD  latanoprost (XALATAN) 0.005 % ophthalmic solution Place 1 drop into both eyes at bedtime.   Yes Historical Provider, MD  nitroGLYCERIN (NITROSTAT) 0.4 MG SL tablet Place 0.4 mg under the tongue every 5 (five) minutes x 3 doses as needed. For chest pain   Yes Historical Provider, MD  nystatin (MYCOSTATIN/NYSTOP) 100000  UNIT/GM POWD Apply 15 g topically every evening.   Yes Historical Provider, MD  omeprazole (PRILOSEC) 20 MG capsule Take 20 mg by mouth daily.   Yes Historical Provider, MD  oxycodone-acetaminophen (PERCOCET) 2.5-325 MG per tablet Take 1 tablet by mouth every 4 (four) hours as needed for pain.   Yes Historical Provider, MD  polyethylene glycol (MIRALAX / GLYCOLAX) packet Take 17 g by mouth daily as needed for moderate constipation.   Yes Historical Provider, MD  psyllium (REGULOID) 0.52 G capsule Take 1.04 g by mouth at bedtime.   Yes Historical Provider, MD  rOPINIRole (REQUIP)  1 MG tablet Take 1 mg by mouth at bedtime.   Yes Historical Provider, MD  senna-docusate (SENOKOT-S) 8.6-50 MG per tablet Take 1 tablet by mouth at bedtime.   Yes Historical Provider, MD  Sodium Polystyrene Sulfonate (KALEXATE) POWD Take 15 g by mouth once a week.   Yes Historical Provider, MD  traZODone (DESYREL) 50 MG tablet Take 50 mg by mouth at bedtime.   Yes Historical Provider, MD   BP 131/69  Pulse 92  Temp(Src) 99.1 F (37.3 C) (Oral)  Resp 18  SpO2 97% Physical Exam  Nursing note and vitals reviewed. Constitutional: She is oriented to person, place, and time. She appears well-developed and well-nourished. No distress.  HENT:  Head: Normocephalic and atraumatic.  Mouth/Throat: Oropharynx is clear and moist.  Eyes: Conjunctivae and EOM are normal. Pupils are equal, round, and reactive to light.  Neck: Normal range of motion. Neck supple.  Cardiovascular: Normal rate, regular rhythm and intact distal pulses.   Murmur heard.  Systolic murmur is present with a grade of 4/6  Pulmonary/Chest: Effort normal. No respiratory distress. She has no wheezes. She has rales in the right lower field and the left lower field.  Abdominal: Soft. She exhibits no distension. There is no tenderness. There is no rebound and no guarding.  Musculoskeletal: Normal range of motion. She exhibits no edema and no tenderness.  Neurological: She is alert and oriented to person, place, and time.  Skin: Skin is warm and dry. No rash noted. No erythema.  Psychiatric: She has a normal mood and affect. Her behavior is normal.    ED Course  Procedures (including critical care time) Labs Review Labs Reviewed  CBC WITH DIFFERENTIAL - Abnormal; Notable for the following:    RBC 2.86 (*)    Hemoglobin 8.6 (*)    HCT 26.6 (*)    Neutrophils Relative % 81 (*)    Lymphocytes Relative 10 (*)    All other components within normal limits  COMPREHENSIVE METABOLIC PANEL - Abnormal; Notable for the following:     Sodium 134 (*)    BUN 38 (*)    Creatinine, Ser 1.96 (*)    Total Bilirubin 0.2 (*)    GFR calc non Af Amer 21 (*)    GFR calc Af Amer 24 (*)    All other components within normal limits  PRO B NATRIURETIC PEPTIDE - Abnormal; Notable for the following:    Pro B Natriuretic peptide (BNP) 44135.0 (*)    All other components within normal limits  URINALYSIS, ROUTINE W REFLEX MICROSCOPIC  I-STAT TROPOININ, ED  I-STAT CG4 LACTIC ACID, ED    Imaging Review Dg Chest 2 View  10/07/2013   CLINICAL DATA:  Recently diagnosis with right lower lobe pneumonia. Persistent chest discomfort, congestion, cough and shortness of breath.  EXAM: CHEST  2 VIEW  COMPARISON:  Chest x-ray 05/30/2012.  FINDINGS: Bibasilar opacities may reflect areas of atelectasis and/or consolidation. Probable small left pleural effusion. In addition, there is some cephalization of the pulmonary vasculature with a background of indistinct interstitial markings and some Kerley B-lines in the lower right long, indicative of a background of mild interstitial pulmonary edema. Mild cardiomegaly. The patient is rotated to the left on today's exam, resulting in distortion of the mediastinal contours and reduced diagnostic sensitivity and specificity for mediastinal pathology. Atherosclerosis in the thoracic aorta.  IMPRESSION: 1. Bibasilar opacities may reflect areas of atelectasis and/or consolidation in the lower lobes of the lungs bilaterally. 2. Mild cardiomegaly with evidence of mild interstitial pulmonary edema and small left pleural effusion. These findings suggest concurrent congestive heart failure. 3. Atherosclerosis.   Electronically Signed   By: Trudie Reedaniel  Entrikin M.D.   On: 10/07/2013 15:08     EKG Interpretation   Date/Time:  Tuesday October 07 2013 14:32:32 EDT Ventricular Rate:  93 PR Interval:  170 QRS Duration: 123 QT Interval:  433 QTC Calculation: 539 R Axis:   -44 Text Interpretation:  Sinus rhythm Atrial premature  complex RBBB and LAFB  No significant change since last tracing Confirmed by Anitra LauthPLUNKETT  MD,  Alphonzo LemmingsWHITNEY ([id number]) on 10/07/2013 2:35:54 PM      MDM   Final diagnoses:  CHF (congestive heart failure)  Chronic kidney disease    Patient was recently treated pneumonia for the last 2 weeks currently on Augmentin who presents with worsening shortness of breath and blood streaked sputum. Patient denies abdominal pain but is having left-sided rib pain. She is able to answer all questions appropriately and currently vital signs are stable.  Patient has a history of CHF but no worsening of peripheral edema.  Patient has not checked her weight regularly to know if there's been a recent change.  Concern for persistent infection non-responsive CAP to outpatient therapy versus CHF versus PE.  Chest x-ray, CBC, CMP, UA, troponin, lactic acid, BNP pending.  3:35 PM CXR with signs of CHF without obvious PNA.  BNP elevated.  Renal function about the same with CRI with cr of 1.96 adn persistent anemia in the 8's which is unchanged.  No leukocytosis or fever at this time.  Doubt PE.  Will give lasix and discuss with hospitalist for admission and if they want to continue abx.  Gwyneth SproutWhitney Alura Olveda, MD 10/07/13 1537  Gwyneth SproutWhitney Shyanne Mcclary, MD 10/07/13 1549

## 2013-10-07 NOTE — ED Notes (Signed)
Notified Plunkett MD about troponin result 0.16

## 2013-10-07 NOTE — ED Notes (Signed)
Per EMS- Patient is a resident of [facility]. Patient's grand daughter reports that the patient has been on antibiotics for 3 weeks and 2 days ago, developed itching. Patient's grand daughter reported that the patient has had hemoptysis and SOB with activity as well.

## 2013-10-08 ENCOUNTER — Other Ambulatory Visit: Payer: Self-pay

## 2013-10-08 DIAGNOSIS — I359 Nonrheumatic aortic valve disorder, unspecified: Secondary | ICD-10-CM

## 2013-10-08 DIAGNOSIS — I5031 Acute diastolic (congestive) heart failure: Principal | ICD-10-CM

## 2013-10-08 DIAGNOSIS — N179 Acute kidney failure, unspecified: Secondary | ICD-10-CM

## 2013-10-08 DIAGNOSIS — N189 Chronic kidney disease, unspecified: Secondary | ICD-10-CM

## 2013-10-08 DIAGNOSIS — I509 Heart failure, unspecified: Secondary | ICD-10-CM

## 2013-10-08 DIAGNOSIS — I2789 Other specified pulmonary heart diseases: Secondary | ICD-10-CM

## 2013-10-08 DIAGNOSIS — R197 Diarrhea, unspecified: Secondary | ICD-10-CM

## 2013-10-08 DIAGNOSIS — I519 Heart disease, unspecified: Secondary | ICD-10-CM

## 2013-10-08 DIAGNOSIS — M4800 Spinal stenosis, site unspecified: Secondary | ICD-10-CM | POA: Diagnosis present

## 2013-10-08 LAB — BASIC METABOLIC PANEL
BUN: 45 mg/dL — AB (ref 6–23)
CHLORIDE: 97 meq/L (ref 96–112)
CO2: 23 mEq/L (ref 19–32)
CREATININE: 2.16 mg/dL — AB (ref 0.50–1.10)
Calcium: 9 mg/dL (ref 8.4–10.5)
GFR calc non Af Amer: 18 mL/min — ABNORMAL LOW (ref >90)
GFR, EST AFRICAN AMERICAN: 21 mL/min — AB (ref >90)
Glucose, Bld: 85 mg/dL (ref 70–99)
Potassium: 4.7 mEq/L (ref 3.7–5.3)
Sodium: 139 mEq/L (ref 137–147)

## 2013-10-08 LAB — TROPONIN I: TROPONIN I: 0.31 ng/mL — AB (ref <0.30)

## 2013-10-08 LAB — CBC
HCT: 31 % — ABNORMAL LOW (ref 36.0–46.0)
Hemoglobin: 9.8 g/dL — ABNORMAL LOW (ref 12.0–15.0)
MCH: 29.3 pg (ref 26.0–34.0)
MCHC: 31.6 g/dL (ref 30.0–36.0)
MCV: 92.8 fL (ref 78.0–100.0)
Platelets: 249 10*3/uL (ref 150–400)
RBC: 3.34 MIL/uL — ABNORMAL LOW (ref 3.87–5.11)
RDW: 13.5 % (ref 11.5–15.5)
WBC: 7.7 10*3/uL (ref 4.0–10.5)

## 2013-10-08 LAB — CLOSTRIDIUM DIFFICILE BY PCR: CDIFFPCR: NEGATIVE

## 2013-10-08 LAB — MAGNESIUM: Magnesium: 1.7 mg/dL (ref 1.5–2.5)

## 2013-10-08 MED ORDER — ONDANSETRON HCL 4 MG/2ML IJ SOLN
2.0000 mg | Freq: Four times a day (QID) | INTRAMUSCULAR | Status: DC | PRN
  Administered 2013-10-08: 2 mg via INTRAVENOUS
  Filled 2013-10-08: qty 2

## 2013-10-08 MED ORDER — FUROSEMIDE 10 MG/ML IJ SOLN
20.0000 mg | Freq: Two times a day (BID) | INTRAMUSCULAR | Status: DC
  Administered 2013-10-08 (×2): 20 mg via INTRAVENOUS
  Filled 2013-10-08 (×2): qty 2

## 2013-10-08 MED ORDER — HEPARIN SODIUM (PORCINE) 5000 UNIT/ML IJ SOLN
5000.0000 [IU] | Freq: Three times a day (TID) | INTRAMUSCULAR | Status: DC
  Administered 2013-10-08 – 2013-10-10 (×5): 5000 [IU] via SUBCUTANEOUS
  Filled 2013-10-08 (×8): qty 1

## 2013-10-08 MED ORDER — PROMETHAZINE HCL 25 MG/ML IJ SOLN
6.2500 mg | Freq: Four times a day (QID) | INTRAMUSCULAR | Status: DC | PRN
  Administered 2013-10-09: 6.25 mg via INTRAVENOUS
  Filled 2013-10-08: qty 1

## 2013-10-08 MED ORDER — LEVOFLOXACIN IN D5W 500 MG/100ML IV SOLN
500.0000 mg | INTRAVENOUS | Status: DC
  Administered 2013-10-09: 500 mg via INTRAVENOUS
  Filled 2013-10-08: qty 100

## 2013-10-08 NOTE — Progress Notes (Signed)
  Echocardiogram 2D Echocardiogram has been performed.  Cathie Beams 10/08/2013, 3:40 PM

## 2013-10-08 NOTE — Progress Notes (Signed)
[facility] PROGRESS NOTE  Jamal Collinauline Delmonaco ZOX:[id number] DOB: 02/22/1917 DOA: 10/07/2013 PCP: Florentina JennyRIPP, HENRY, MD  Assessment/Plan: #1 acute diastolic CHF exacerbation Patient with clinical improvement. Cardiac enzymes which was cycled had one enzyme which was slightly elevated at 0.31 otherwise rest with negative x2. Patient denies any chest pain. EKG with no significant change since prior EKG. I/O. = -1.95 L. Patient with borderline blood pressure. Patient also with chronic kidney disease. Will check a basic metabolic profile. Pro BNP on admission was [id number]. Will decrease IV Lasix to 20 mg IV every 12 hours. Continue aspirin. 2-D echo is pending. Follow. The blood pressure improves may consider adding a low-dose beta blocker.  #2 ?? Resolving pneumonia. Patient was on antibiotics prior to admission. Patient has been started back on Levaquin we'll treat for 5 days to complete a course.  #3 diarrhea Stool studies pending. Follow.  #4. CKD stage 3-4 Stable. Monitor closely with diuresis.  #5 anemia Likely anemia of chronic disease. Follow H&H. No overt signs of bleeding.  #6 spinal stenosis Continue home pain regimen.  #7 prophylaxis PPI for GI prophylaxis. Heparin for DVT prophylaxis.  Code Status: DO NOT RESUSCITATE Family Communication: Updated patient no family at bedside. Disposition Plan: Back to SNF when medically stable.   Consultants:  None  Procedures:  Chest x-ray 10/07/2013  Antibiotics:  Levaquin 10/07/2013  HPI/Subjective: Patient states shortness of breath has improved since admission and is feeling better. Patient denies any diarrhea.  Objective: Filed Vitals:   10/08/13 0447  BP: 107/65  Pulse: 91  Temp: 98.2 F (36.8 C)  Resp: 24    Intake/Output Summary (Last 24 hours) at 10/08/13 1030 Last data filed at 10/08/13 0900  Gross per 24 hour  Intake    460 ml  Output   2050 ml  Net  -1590 ml   Filed Weights   10/07/13 1734 10/08/13 0447   Weight: 61.961 kg (136 lb 9.6 oz) 60.691 kg (133 lb 12.8 oz)    Exam:   General:  NAD  Cardiovascular: RRR WITH 3/6 sem  Respiratory: Decreased breath sounds in the bases. Occasional scattered crackles.  Abdomen: Soft, nontender, nondistended, positive bowel sounds.  Musculoskeletal: No clubbing no cyanosis. Trace bilateral lower extremity edema.  Data Reviewed: Basic Metabolic Panel:  Recent Labs Lab 10/07/13 1418  NA 134*  K 4.8  CL 97  CO2 19  GLUCOSE 93  BUN 38*  CREATININE 1.96*  CALCIUM 8.6   Liver Function Tests:  Recent Labs Lab 10/07/13 1418  AST 12  ALT 7  ALKPHOS 62  BILITOT 0.2*  PROT 7.3  ALBUMIN 4.0   No results found for this basename: LIPASE, AMYLASE,  in the last 168 hours No results found for this basename: AMMONIA,  in the last 168 hours CBC:  Recent Labs Lab 10/07/13 1418  WBC 7.2  NEUTROABS 5.9  HGB 8.6*  HCT 26.6*  MCV 93.0  PLT 207   Cardiac Enzymes:  Recent Labs Lab 10/07/13 2012 10/08/13 0120 10/08/13 0810  TROPONINI <0.30 0.31* <0.30   BNP (last 3 results)  Recent Labs  10/07/13 1418  PROBNP 44135.0*   CBG: No results found for this basename: GLUCAP,  in the last 168 hours  Recent Results (from the past 240 hour(s))  MRSA PCR SCREENING     Status: None   Collection Time    10/07/13  5:13 PM      Result Value Ref Range Status   MRSA by PCR NEGATIVE  NEGATIVE  Final   Comment:            The GeneXpert MRSA Assay (FDA     approved for NASAL specimens     only), is one component of a     comprehensive MRSA colonization     surveillance program. It is not     intended to diagnose MRSA     infection nor to guide or     monitor treatment for     MRSA infections.     Studies: Dg Chest 2 View  10/07/2013   CLINICAL DATA:  Recently diagnosis with right lower lobe pneumonia. Persistent chest discomfort, congestion, cough and shortness of breath.  EXAM: CHEST  2 VIEW  COMPARISON:  Chest x-ray 05/30/2012.   FINDINGS: Bibasilar opacities may reflect areas of atelectasis and/or consolidation. Probable small left pleural effusion. In addition, there is some cephalization of the pulmonary vasculature with a background of indistinct interstitial markings and some Kerley B-lines in the lower right long, indicative of a background of mild interstitial pulmonary edema. Mild cardiomegaly. The patient is rotated to the left on today's exam, resulting in distortion of the mediastinal contours and reduced diagnostic sensitivity and specificity for mediastinal pathology. Atherosclerosis in the thoracic aorta.  IMPRESSION: 1. Bibasilar opacities may reflect areas of atelectasis and/or consolidation in the lower lobes of the lungs bilaterally. 2. Mild cardiomegaly with evidence of mild interstitial pulmonary edema and small left pleural effusion. These findings suggest concurrent congestive heart failure. 3. Atherosclerosis.   Electronically Signed   By: Trudie Reed M.D.   On: 10/07/2013 15:08    Scheduled Meds: . acidophilus  1 capsule Oral Daily  . antiseptic oral rinse  15 mL Mouth Rinse BID  . aspirin  81 mg Oral Daily  . [START ON 10/10/2013] fentaNYL  25 mcg Transdermal Q72H  . furosemide  40 mg Intravenous Q12H  . heparin subcutaneous  5,000 Units Subcutaneous 3 times per day  . latanoprost  1 drop Both Eyes QHS  . [START ON 10/09/2013] levofloxacin (LEVAQUIN) IV  500 mg Intravenous Q48H  . pantoprazole  40 mg Oral Q1200  . rOPINIRole  1 mg Oral QHS  . sodium chloride  3 mL Intravenous Q12H  . traZODone  50 mg Oral QHS   Continuous Infusions:   Principal Problem:   Acute diastolic CHF (congestive heart failure) Active Problems:   Protein malnutrition   CKD (chronic kidney disease), stage III   Anemia   Spinal stenosis   Diarrhea    Time spent: 40 mins    [facility] MD [facility] Pager [phone]. If 7PM-7AM, please contact night-coverage at [email or web address], password  [facility] 10/08/2013, 10:30 AM  LOS: 1 day

## 2013-10-08 NOTE — Consult Note (Signed)
Reason for Consult: Congestive heart failure/critical aortic stenosis  Requesting Physician: Janee Morn  HPI: Ms. Candice Barker is a delightful 80 year old widowed Caucasian female originally from [place] move to [place] to be closer to her  grandson. Unfortunately her only son is deceased. She was in a nursing home. She relates a one month history of progressive dyspnea on exertion, orthopnea and PND. She denies chest pain. She has no significant cardiac risk factors she has never had a heart attack or stroke. She does have chronic renal insufficiency with a creatinine runs in the 1.7-2.0 range as evidenced during an admission back in 2014. At that time a 2-D echocardiogram revealed normal LV systolic function, severe aortic stenosis, mild MR and TR as well as moderate to severe pulmonary hypertension. A 2-D echocardiogram performed during the current hospitalization revealed severe LV dysfunction with an ejection fraction of 25% and critical aortic stenosis with valve area of approximately 0.5 cm, moderate MR and TR elevated filling pressures. She and diuresed approximately 2-1/2 L. She feels clinically improved. Asked to see her in consultation to discuss options for treatment of her CHF and aortic stenosis. Problem List: Patient Active Problem List   Diagnosis Date Noted  . Spinal stenosis 10/08/2013  . Diarrhea 10/08/2013  . Acute diastolic CHF (congestive heart failure) 10/07/2013  . CKD (chronic kidney disease), stage III 10/07/2013  . Anemia 10/07/2013  . Metabolic encephalopathy 05/29/2012  . Acute kidney injury 05/29/2012  . Hyperkalemia 05/29/2012  . Metabolic acidosis 05/29/2012  . Protein malnutrition 05/29/2012  . Hypotension 05/29/2012  . Sinus tachycardia 05/29/2012    PMHx:  Past Medical History  Diagnosis Date  . Heart failure   . Spinal stenosis   . Edema   . Glaucoma   . Diabetes mellitus without complication   . GERD (gastroesophageal  reflux disease)   . Depression   . Bronchitis   . Endocarditis    Past Surgical History  Procedure Laterality Date  . Appendectomy    . Knee surgery    . Bilateral catract extraction      FAMHx: Family History  Problem Relation Age of Onset  . Other Mother   . Other Father     SOCHx:  reports that she has quit smoking. Her smoking use included Cigarettes. She smoked 1.00 pack per day. She has never used smokeless tobacco. She reports that she does not drink alcohol or use illicit drugs.  ALLERGIES: No Known Allergies  ROS: Pertinent items are noted in HPI.  HOME MEDICATIONS: Prescriptions prior to admission  Medication Sig Dispense Refill  . acetaminophen (TYLENOL) 500 MG tablet Take 1,000 mg by mouth 3 (three) times daily.      Marland Kitchen acidophilus (RISAQUAD) CAPS capsule Take 1 capsule by mouth daily.      Marland Kitchen aspirin 81 MG tablet Take 81 mg by mouth daily.      . calcium carbonate (TUMS - DOSED IN MG ELEMENTAL CALCIUM) 500 MG chewable tablet Chew 1 tablet by mouth every 4 (four) hours as needed for indigestion or heartburn.      . fentaNYL (DURAGESIC) 25 MCG/HR Place 1 patch (25 mcg total) onto the skin every 3 (three) days.  5 patch  0  . furosemide (LASIX) 20 MG tablet Take 20 mg by mouth daily.      Marland Kitchen latanoprost (XALATAN) 0.005 % ophthalmic solution Place 1 drop into both eyes at bedtime.      . nitroGLYCERIN (NITROSTAT) 0.4 MG SL tablet  Place 0.4 mg under the tongue every 5 (five) minutes x 3 doses as needed. For chest pain      . nystatin (MYCOSTATIN/NYSTOP) 100000 UNIT/GM POWD Apply 15 g topically every evening.      Marland Kitchen. omeprazole (PRILOSEC) 20 MG capsule Take 20 mg by mouth daily.      Marland Kitchen. oxycodone-acetaminophen (PERCOCET) 2.5-325 MG per tablet Take 1 tablet by mouth every 4 (four) hours as needed for pain.      . polyethylene glycol (MIRALAX / GLYCOLAX) packet Take 17 g by mouth daily as needed for moderate constipation.      . psyllium (REGULOID) 0.52 G capsule Take 1.04  g by mouth at bedtime.      Marland Kitchen. rOPINIRole (REQUIP) 1 MG tablet Take 1 mg by mouth at bedtime.      . senna-docusate (SENOKOT-S) 8.6-50 MG per tablet Take 1 tablet by mouth at bedtime.      . Sodium Polystyrene Sulfonate (KALEXATE) POWD Take 15 g by mouth once a week.      . traZODone (DESYREL) 50 MG tablet Take 50 mg by mouth at bedtime.        HOSPITAL MEDICATIONS: I have reviewed the patient's current medications.  VITALS: Blood pressure 114/63, pulse 98, temperature 98.1 F (36.7 C), temperature source Oral, resp. rate 26, height 5\' 3"  (1.6 m), weight 133 lb 12.8 oz (60.691 kg), SpO2 100.00%.  INPUT/OUTPUT I/O last 3 completed shifts: In: 100 [IV Piggyback:100] Out: 2050 [Urine:2050] Total I/O In: 600 [P.O.:600] Out: 600 [Urine:600]    PHYSICAL EXAM: General appearance: alert and no distress Neck: no adenopathy, no JVD, supple, symmetrical, trachea midline, thyroid not enlarged, symmetric, no tenderness/mass/nodules and transmitted murmur versus bruit bilaterally Lungs: clear to auscultation bilaterally Heart: typical aortic stenosis number Extremities: extremities normal, atraumatic, no cyanosis or edema  LABS:  BMP  Recent Labs  10/07/13 1418 10/08/13 1208  NA 134* 139  K 4.8 4.7  CL 97 97  CO2 19 23  GLUCOSE 93 85  BUN 38* 45*  CREATININE 1.96* 2.16*  CALCIUM 8.6 9.0  GFRNONAA 21* 18*  GFRAA 24* 21*    CBC  Recent Labs Lab 10/08/13 1208  WBC 7.7  RBC 3.34*  HGB 9.8*  HCT 31.0*  PLT 249  MCV 92.8    HEMOGLOBIN A1C No results found for this basename: HGBA1C, MPG    Cardiac Panel (last 3 results)  Recent Labs  10/07/13 2012 10/08/13 0120 10/08/13 0810  TROPONINI <0.30 0.31* <0.30    BNP (last 3 results)  Recent Labs  10/07/13 1418  PROBNP 44135.0*    TSH No results found for this basename: TSH,  in the last 8760 hours  CHOLESTEROL No results found for this basename: CHOL,  in the last 8760 hours  Hepatic Function  Panel  Recent Labs  10/07/13 1418  PROT 7.3  ALBUMIN 4.0  AST 12  ALT 7  ALKPHOS 62  BILITOT 0.2*    IMAGING: Dg Chest 2 View  10/07/2013   CLINICAL DATA:  Recently diagnosis with right lower lobe pneumonia. Persistent chest discomfort, congestion, cough and shortness of breath.  EXAM: CHEST  2 VIEW  COMPARISON:  Chest x-ray 05/30/2012.  FINDINGS: Bibasilar opacities may reflect areas of atelectasis and/or consolidation. Probable small left pleural effusion. In addition, there is some cephalization of the pulmonary vasculature with a background of indistinct interstitial markings and some Kerley B-lines in the lower right long, indicative of a background of mild interstitial pulmonary edema.  Mild cardiomegaly. The patient is rotated to the left on today's exam, resulting in distortion of the mediastinal contours and reduced diagnostic sensitivity and specificity for mediastinal pathology. Atherosclerosis in the thoracic aorta.  IMPRESSION: 1. Bibasilar opacities may reflect areas of atelectasis and/or consolidation in the lower lobes of the lungs bilaterally. 2. Mild cardiomegaly with evidence of mild interstitial pulmonary edema and small left pleural effusion. These findings suggest concurrent congestive heart failure. 3. Atherosclerosis.   Electronically Signed   By: Candice Reed M.D.   On: 10/07/2013 15:08    IMPRESSION: 1. Systolic heart failure, critical aortic stenosis  2.   Chronic renal insufficiency   RECOMMENDATION:   1. She has had significant clinical improvement with IV diuretics.Her BNP on admission was 44K  Her home dose of Lasix 20 mg is  either daily or twice a day. She is on no other chronic medications. IV diuretic dose has been cut back from 40 mg IV twice a day 20 mg IV twice a day. She may benefit from the addition of a low-dose beta blocker such as Coreg 3.125 mg by mouth twice a day. I have discussed her case with Candice Barker who feels that she may be an  appropriate candidate for palliative balloon aortic valvuloplasty. Clearly she is not a candidate for aortic valve replacement and probably would have an increased risk from TAVR. We will assist in  adjusting  her oral medications and arrange for her to see Candice Barker in close outpatient followup to discuss percutaneous options. I have thoroughly reviewed this with the patient and her grandson.  2. CRI- Stable, at baseline   Time Spent Directly with Patient: 60 minutes  Candice Barker,Candice Barker 10/08/2013, 6:31 PM

## 2013-10-09 ENCOUNTER — Encounter (HOSPITAL_COMMUNITY): Payer: Self-pay | Admitting: Internal Medicine

## 2013-10-09 DIAGNOSIS — N183 Chronic kidney disease, stage 3 unspecified: Secondary | ICD-10-CM

## 2013-10-09 DIAGNOSIS — I428 Other cardiomyopathies: Secondary | ICD-10-CM

## 2013-10-09 DIAGNOSIS — I350 Nonrheumatic aortic (valve) stenosis: Secondary | ICD-10-CM | POA: Diagnosis present

## 2013-10-09 DIAGNOSIS — I359 Nonrheumatic aortic valve disorder, unspecified: Secondary | ICD-10-CM

## 2013-10-09 DIAGNOSIS — I429 Cardiomyopathy, unspecified: Secondary | ICD-10-CM

## 2013-10-09 DIAGNOSIS — I5041 Acute combined systolic (congestive) and diastolic (congestive) heart failure: Secondary | ICD-10-CM

## 2013-10-09 HISTORY — DX: Cardiomyopathy, unspecified: I42.9

## 2013-10-09 LAB — BASIC METABOLIC PANEL
BUN: 50 mg/dL — ABNORMAL HIGH (ref 6–23)
CALCIUM: 8.9 mg/dL (ref 8.4–10.5)
CO2: 24 mEq/L (ref 19–32)
CREATININE: 2.35 mg/dL — AB (ref 0.50–1.10)
Chloride: 94 mEq/L — ABNORMAL LOW (ref 96–112)
GFR calc Af Amer: 19 mL/min — ABNORMAL LOW (ref >90)
GFR, EST NON AFRICAN AMERICAN: 17 mL/min — AB (ref >90)
Glucose, Bld: 112 mg/dL — ABNORMAL HIGH (ref 70–99)
Potassium: 4.6 mEq/L (ref 3.7–5.3)
SODIUM: 137 meq/L (ref 137–147)

## 2013-10-09 LAB — PRO B NATRIURETIC PEPTIDE: Pro B Natriuretic peptide (BNP): 52186 pg/mL — ABNORMAL HIGH (ref 0–450)

## 2013-10-09 MED ORDER — HYDROXYZINE HCL 10 MG PO TABS
10.0000 mg | ORAL_TABLET | Freq: Four times a day (QID) | ORAL | Status: DC | PRN
  Administered 2013-10-10 – 2013-10-11 (×2): 10 mg via ORAL
  Filled 2013-10-09 (×4): qty 1

## 2013-10-09 MED ORDER — FUROSEMIDE 20 MG PO TABS
20.0000 mg | ORAL_TABLET | Freq: Two times a day (BID) | ORAL | Status: DC
  Administered 2013-10-09 (×2): 20 mg via ORAL
  Filled 2013-10-09 (×5): qty 1

## 2013-10-09 NOTE — Evaluation (Signed)
Physical Therapy Evaluation Patient Details Name: Candice Barker MRN: [id number] DOB: May 21, 1916 Today's Date: 10/09/2013   History of Present Illness  Pt is a 80 y.o. female with prior h/o hypertension, CHF, recently diagnosed with pneumonia. Pt admitted with SOB X 2 weeks. Per cardiology, not a surgical candidate for valve replacement.  Clinical Impression  Pt tolerated  Mobility from bed to recliner. Pt will benefit from PT to address problems listed. Mobility may be limited due to patient's diagnosis.    Follow Up Recommendations SNF;Supervision/Assistance - 24 hour    Equipment Recommendations       Recommendations for Other Services       Precautions / Restrictions Precautions Precautions: Fall      Mobility  Bed Mobility Overal bed mobility: Needs Assistance Bed Mobility: Supine to Sit     Supine to sit: Mod assist     General bed mobility comments: with use of pad to scoot hips to EOB.  Transfers Overall transfer level: Needs assistance Equipment used: Rolling walker (2 wheeled) Transfers: Sit to/from [employer] Sit to Stand: Min assist Stand pivot transfers: Min assist       General transfer comment: verbal cues for hand placement. assist to rise and steady and also control descent ,   Ambulation/Gait Ambulation/Gait assistance: Min assist Ambulation Distance (Feet): 5 Feet Assistive device: Rolling walker (2 wheeled)       General Gait Details: extra time for  mobility, cues for backing up to recliner.  Stairs            Wheelchair Mobility    Modified Rankin (Stroke Patients Only)       Balance                                             Pertinent Vitals/Pain HR 104, sats 96% RA.    Home Living Family/patient expects to be discharged to:: Skilled nursing facility                 Additional Comments: Palliative care consult possibly    Prior Function Level of Independence: Needs  assistance   Gait / Transfers Assistance Needed: Used walker to go to meals in dining room but more recently needed to use wheelchair since becoming sick.  ADL's / Homemaking Assistance Needed: pt was independent with bathing, dressing, grooming toileting at ALF. Assist with meals there.        Hand Dominance        Extremity/Trunk Assessment   Upper Extremity Assessment: Defer to OT evaluation           Lower Extremity Assessment: Generalized weakness         Communication   Communication: HOH  Cognition Arousal/Alertness: Awake/alert Behavior During Therapy: WFL for tasks assessed/performed Overall Cognitive Status: Within Functional Limits for tasks assessed                      General Comments      Exercises        Assessment/Plan    PT Assessment Patient needs continued PT services  PT Diagnosis Difficulty walking;Generalized weakness   PT Problem List Decreased strength;Decreased activity tolerance;Decreased mobility;Decreased knowledge of precautions;Decreased knowledge of use of DME;Decreased safety awareness;Cardiopulmonary status limiting activity  PT Treatment Interventions DME instruction;Gait training;Functional mobility training;Therapeutic activities;Patient/family education   PT Goals (Current goals can be found  in the Care Plan section) Acute Rehab PT Goals Patient Stated Goal: agreeable to get up with OT/PT PT Goal Formulation: With patient Time For Goal Achievement: 10/23/13 Potential to Achieve Goals: Fair    Frequency Min 3X/week   Barriers to discharge        Co-evaluation PT/OT/SLP Co-Evaluation/Treatment: Yes Reason for Co-Treatment: For patient/therapist safety PT goals addressed during session: Mobility/safety with mobility         End of Session   Activity Tolerance: Patient limited by fatigue Patient left: in chair;with call bell/phone within reach Nurse Communication: Mobility status         Time:  1202-1238 PT Time Calculation (min): 36 min   Charges:   PT Evaluation $Initial PT Evaluation Tier I: 1 Procedure PT Treatments $Therapeutic Activity: 8-22 mins   PT G Codes:          Candice Barker, Candice Barker Candice 10/09/2013, 2:05 PM Blanchard KelchKaren Nicolina Barker PT [phone]

## 2013-10-09 NOTE — Progress Notes (Signed)
    Subjective:  Denies CP; dyspnea resolved.   Objective:  Filed Vitals:   10/08/13 0447 10/08/13 1354 10/08/13 2154 10/09/13 0438  BP: 107/65 114/63 95/59 109/67  Pulse: 91 98 96 107  Temp: 98.2 F (36.8 C) 98.1 F (36.7 C) 97.5 F (36.4 C) 97.3 F (36.3 C)  TempSrc: Oral Oral Axillary Axillary  Resp: 24 26 20 20   Height:      Weight: 133 lb 12.8 oz (60.691 kg)   129 lb 3 oz (58.6 kg)  SpO2: 98% 100% 96% 95%    Intake/Output from previous day:  Intake/Output Summary (Last 24 hours) at 10/09/13 0707 Last data filed at 10/09/13 0449  Gross per 24 hour  Intake    720 ml  Output   1950 ml  Net  -1230 ml    Physical Exam: Physical exam: Well-developed well-nourished in no acute distress.  Skin is warm and dry.  HEENT is normal.  Neck is supple.  Chest with basilar crackles Cardiovascular exam is regular rate and rhythm. 3/6 systolic murmur left sternal border. S2 is diminished. Abdominal exam nontender or distended. No masses palpated. Extremities show no edema. neuro grossly intact    Lab Results: Basic Metabolic Panel:  Recent Labs  59/74/16 1208 10/09/13 0404  NA 139 137  K 4.7 4.6  CL 97 94*  CO2 23 24  GLUCOSE 85 112*  BUN 45* 50*  CREATININE 2.16* 2.35*  CALCIUM 9.0 8.9  MG 1.7  --    CBC:  Recent Labs  10/07/13 1418 10/08/13 1208  WBC 7.2 7.7  NEUTROABS 5.9  --   HGB 8.6* 9.8*  HCT 26.6* 31.0*  MCV 93.0 92.8  PLT 207 249   Cardiac Enzymes:  Recent Labs  10/07/13 2012 10/08/13 0120 10/08/13 0810  TROPONINI <0.30 0.31* <0.30     Assessment/Plan:  1 severe aortic stenosis-the patient has decreased LV function and critical aortic stenosis. She presented with pulmonary edema. She is symptomatically improved with diuresis. Dr. Allyson Sabal discussed possible balloon valvuloplasty last evening. Given her age she is not a candidate for aortic valve replacement. She discussed this with her grandson last evening and she is hesitant to  proceed. She states "I am old". Further discussions with her family today. Her renal function has deteriorated some with diuresis. Change Lasix to 20 mg by mouth twice a day and follow. She still has pulmonary edema on examination. Given her age and the fact that balloon valvuloplasty would be a very temporizing measure I would favor conservative therapy. Patient understands that aortic stenosis will eventually take her life. 2 cardiomyopathy-I would not add a beta blocker as her blood pressure is borderline and she has critical aortic stenosis. Not a candidate for ACE inhibitor given borderline blood pressure and renal insufficiency. 3 acute on chronic renal failure-follow renal function closely with diuretics.  Olga Millers 10/09/2013, 7:07 AM

## 2013-10-09 NOTE — Evaluation (Signed)
Occupational Therapy Evaluation Patient Details Name: Candice Barker MRN: [id number] DOB: 1917/03/25 Today's Date: 10/09/2013    History of Present Illness Pt is a 80 y.o. female with prior h/o hypertension, CHF, recently diagnosed with pneumonia. Pt admitted with SOB X 2 weeks.   Clinical Impression   Pt up to [facility] and then to chair to sit up for lunch. In discussing initial d/c plans with pt and granddaugther in law, pt voicing that she feels she will need to go to a SNF at d/c. Feel pt will need 24/7 assist at d/c. Palliative care meeting planned also. Will see for OT to work on safety and independence with self care tasks.     Follow Up Recommendations  SNF;Supervision/Assistance - 24 hour (palliative care meeting taking place also. )    Equipment Recommendations  Other (comment) (defer to next venue)    Recommendations for Other Services       Precautions / Restrictions Precautions Precautions: Fall      Mobility Bed Mobility Overal bed mobility: Needs Assistance Bed Mobility: Supine to Sit     Supine to sit: Mod assist     General bed mobility comments: with use of pad to scoot hips to EOB.  Transfers Overall transfer level: Needs assistance Equipment used: Rolling walker (2 wheeled) Transfers: Sit to/from Stand Sit to Stand: Min assist         General transfer comment: verbal cues for hand placement. assist to rise and steady and also control descent     Balance                                            ADL Overall ADL's : Needs assistance/impaired Eating/Feeding: Set up;Sitting   Grooming: Wash/dry hands;Set up;Sitting   Upper Body Bathing: Minimal assitance;Sitting   Lower Body Bathing: Sit to/from stand;Minimal assistance   Upper Body Dressing : Minimal assistance;Sitting   Lower Body Dressing: Moderate assistance;Sit to/from stand   Toilet Transfer: Minimal assistance;Stand-pivot;BSC;RW   Toileting- Clothing Manipulation  and Hygiene: Minimal assistance;Sit to/from stand         General ADL Comments: Assisted pt up to [facility] and then pt agreeable to sit in chair for lunch. She is very pleasant and reports she doesnt feel she will be able to return to her ALF. She states she feels "very weak" today.     Vision                     Perception     Praxis      Pertinent Vitals/Pain No complaint of pain HR at EOB 105 and O2 95%     Hand Dominance     Extremity/Trunk Assessment Upper Extremity Assessment Upper Extremity Assessment: Generalized weakness           Communication Communication Communication: HOH   Cognition Arousal/Alertness: Awake/alert Behavior During Therapy: WFL for tasks assessed/performed Overall Cognitive Status: Within Functional Limits for tasks assessed                     General Comments       Exercises       Shoulder Instructions      Home Living Family/patient expects to be discharged to:: Skilled nursing facility  Prior Functioning/Environment Level of Independence: Needs assistance  Gait / Transfers Assistance Needed: Used walker to go to meals in dining room but more recently needed to use wheelchair since becoming sick. ADL's / Homemaking Assistance Needed: pt was independent with bathing, dressing, grooming toileting at ALF. Assist with meals there.        OT Diagnosis: Generalized weakness   OT Problem List: Decreased strength;Decreased knowledge of use of DME or AE   OT Treatment/Interventions: Self-care/ADL training;Patient/family education;Therapeutic activities;DME and/or AE instruction    OT Goals(Current goals can be found in the care plan section) Acute Rehab OT Goals Patient Stated Goal: agreeable to get up with OT/PT OT Goal Formulation: With patient Time For Goal Achievement: 10/23/13 Potential to Achieve Goals: Good  OT Frequency: Min 2X/week   Barriers to D/C:             Co-evaluation              End of Session Equipment Utilized During Treatment: Rolling walker Nurse Communication: Mobility status  Activity Tolerance: Patient tolerated treatment well Patient left: in chair;with call bell/phone within reach   Time: 1202-1233 OT Time Calculation (min): 31 min Charges:  OT General Charges $OT Visit: 1 Procedure OT Evaluation $Initial OT Evaluation Tier I: 1 Procedure OT Treatments $Therapeutic Activity: 8-22 mins G-Codes:    Lennox LaityStone, Stephanie Stafford [phone] 10/09/2013, 1:09 PM

## 2013-10-09 NOTE — Progress Notes (Signed)
Clinical Social Work Department BRIEF PSYCHOSOCIAL ASSESSMENT 10/09/2013  Patient:  Candice Barker, Candice Barker     Account Number:  [id number]     Admit date:  10/07/2013  Clinical Social Worker:  Renold Genta  Date/Time:  10/09/2013 06:46 PM  Referred by:  Physician  Date Referred:  10/09/2013 Referred for  Other - See comment   Other Referral:   Ad[facility]   Interview type:  Patient Other interview type:   and grandaughter-in-law, Candice Barker at bedside    PSYCHOSOCIAL DATA Living Status:  FACILITY Admitted from facility:  [facility] Level of care:  Assisted Living Primary support name:  Candice Barker & Candice Barker (grandson & grandaughter-in-law) c#: Primary support relationship to patient:  FAMILY Degree of support available:   good    CURRENT CONCERNS Current Concerns  Post-Acute Placement   Other Concerns:    SOCIAL WORK ASSESSMENT / PLAN CSW received referral that patient was admitted from ALF.   Assessment/plan status:  Information/Referral to [employer] Other assessment/ plan:   Information/referral to community resources:   CSW completed FL2 and faxed information to ALF, confirmed with Candice Barker that patient should be able to return to ALF pending clinical review.    PATIENT'S/FAMILY'S RESPONSE TO PLAN OF CARE: CSW met with patient & [name] at bedside re: discharge planning. Awaiting Palliative Care Consult (tenatively scheduled for Friday morning), CSW will follow-up with ALF to confirm that they would indeed be able to accept patient back. CSW faxed information out to SNFs in [place] they determine that she would need to go to SNF. Per Grandaughter-in-law, patient was receiving palliative services through [facility], Candice Barker (ph#: [phone]). CSW left message for her re: services, possibility to increase care at ALF.     Patient states that she is just unsure whether she feels that she is able to participate with therapy if she were to go to a SNF. Patient & family hopeful that ALF would be able to take patient back.       Raynaldo Opitz, [facility] Clinical Social Worker cell #: [phone]

## 2013-10-09 NOTE — Progress Notes (Signed)
[facility] PROGRESS NOTE  Candice Barker ZOX:[id number] DOB: 1916/05/20 DOA: 10/07/2013 PCP: Florentina Jenny, MD  Assessment/Plan: #1 acute systolic and diastolic CHF exacerbation Patient with clinical improvement. Likely secondary to severe aortic stenosis noted on 2-D echo. Cardiac enzymes which was cycled had one enzyme which was slightly elevated at 0.31 otherwise rest with negative x2. Patient denies any chest pain. EKG with no significant change since prior EKG. I/O. = -3.18 L. Patient with borderline blood pressure. Patient also with chronic kidney disease and rising creatinine. IV Lasix has been changed to oral Lasix per cardiology. WPro BNP on admission was 81191. Will decrease IV Lasix to 20 mg IV every 12 hours. Continue aspirin. 2-D echo with EF of 25-30% and severe aortic stenosis with valve area of 0.51 cm. Pulmonary artery systolic pressure was severely increased with PA pressure of 71 mmHg. Continue oral diuretics as per cardiology. Unable to place on a beta blocker secondary to borderline blood pressure. Patient on ACE inhibitor candidate secondary to borderline blood pressure in chronic kidney disease. Cardiology following and appreciate input and recommendations.  #2 critical severe aortic stenosis/cardiomyopathy Per 2-D echo patient also with a severely depressed left ventricular function and critical aortic stenosis via phone 25-30%. Valve area of 0.51 cm. Patient has been seen by cardiology and also discussion about possible balloon valvuloplasty. Due to patient's age is not a candidate for aortic valve replacement. Renal function has deteriorated with some diuresis as well. Patient at this point in time is not inclined to have any further invasive procedures. This was discussed with the grandson who is also in agreement. Continue medical management per cardiology for some symptomatic relief. Will also consult palliative care medicine for goals of care.  #3 ?? Resolving  pneumonia. Patient was on antibiotics prior to admission. Patient has been started back on Levaquin will treat for 5 days to complete a course.  #4 diarrhea C. difficile PCR is negative. Follow.  #5. CKD stage 3-4 Stable. Creatinine slowly rising. IV Lasix has been changed to oral Lasix per cardiology. Follow.  #6 anemia Likely anemia of chronic disease. Follow H&H. No overt signs of bleeding.  #7 spinal stenosis Continue home pain regimen.  #8 prophylaxis PPI for GI prophylaxis. Heparin for DVT prophylaxis.  #9 prognosis Patient is presenting with symptomatic severe critical aortic stenosis with cardiomyopathy and severely depressed left ventricular function. Patient with borderline blood pressure and a such a be placed on a beta blocker or ACE inhibitor secondary to chronic kidney disease. Patient due to her age is not a candidate for aortic valve replacement. Patient at this time does not want to proceed with any invasive procedures. Will continue diuresis and medical management as per cardiology for some symptomatic relief. Will consult palliative care medicine for goals of care.  Code Status: DO NOT RESUSCITATE Family Communication: Updated patient no family at bedside. Disposition Plan: Back to SNF when medically stable.   Consultants:  Cardiology: Dr. Allyson Sabal 10/08/2013  Procedures:  Chest x-ray 10/07/2013  2-D echo 10/08/2013  Antibiotics:  Levaquin 10/07/2013  HPI/Subjective: Patient states shortness of breath has improved since admission and is feeling better. Patient denies any diarrhea. Patient states she's feeling very weak today and drained.  Objective: Filed Vitals:   10/09/13 1407  BP: 92/58  Pulse:   Temp:   Resp:     Intake/Output Summary (Last 24 hours) at 10/09/13 1737 Last data filed at 10/09/13 1704  Gross per 24 hour  Intake    320 ml  Output   1850 ml  Net  -1530 ml   Filed Weights   10/07/13 1734 10/08/13 0447 10/09/13 0438  Weight:  61.961 kg (136 lb 9.6 oz) 60.691 kg (133 lb 12.8 oz) 58.6 kg (129 lb 3 oz)    Exam:   General:  NAD  Cardiovascular: RRR WITH 3/6 sem left sternal border  Respiratory: Decreased breath sounds in the bases. Bibasilar crackles.  Abdomen: Soft, nontender, nondistended, positive bowel sounds.  Musculoskeletal: No clubbing no cyanosis. Trace bilateral lower extremity edema.  Data Reviewed: Basic Metabolic Panel:  Recent Labs Lab 10/07/13 1418 10/08/13 1208 10/09/13 0404  NA 134* 139 137  K 4.8 4.7 4.6  CL 97 97 94*  CO2 19 23 24   GLUCOSE 93 85 112*  BUN 38* 45* 50*  CREATININE 1.96* 2.16* 2.35*  CALCIUM 8.6 9.0 8.9  MG  --  1.7  --    Liver Function Tests:  Recent Labs Lab 10/07/13 1418  AST 12  ALT 7  ALKPHOS 62  BILITOT 0.2*  PROT 7.3  ALBUMIN 4.0   No results found for this basename: LIPASE, AMYLASE,  in the last 168 hours No results found for this basename: AMMONIA,  in the last 168 hours CBC:  Recent Labs Lab 10/07/13 1418 10/08/13 1208  WBC 7.2 7.7  NEUTROABS 5.9  --   HGB 8.6* 9.8*  HCT 26.6* 31.0*  MCV 93.0 92.8  PLT 207 249   Cardiac Enzymes:  Recent Labs Lab 10/07/13 2012 10/08/13 0120 10/08/13 0810  TROPONINI <0.30 0.31* <0.30   BNP (last 3 results)  Recent Labs  10/07/13 1418 10/09/13 0404  PROBNP 44135.0* 52186.0*   CBG: No results found for this basename: GLUCAP,  in the last 168 hours  Recent Results (from the past 240 hour(s))  MRSA PCR SCREENING     Status: None   Collection Time    10/07/13  5:13 PM      Result Value Ref Range Status   MRSA by PCR NEGATIVE  NEGATIVE Final   Comment:            The GeneXpert MRSA Assay (FDA     approved for NASAL specimens     only), is one component of a     comprehensive MRSA colonization     surveillance program. It is not     intended to diagnose MRSA     infection nor to guide or     monitor treatment for     MRSA infections.  CLOSTRIDIUM DIFFICILE BY PCR     Status:  None   Collection Time    10/08/13 10:50 AM      Result Value Ref Range Status   C difficile by pcr NEGATIVE  NEGATIVE Final   Comment: Performed at [facility]      Studies: No results found.  Scheduled Meds: . acidophilus  1 capsule Oral Daily  . antiseptic oral rinse  15 mL Mouth Rinse BID  . aspirin  81 mg Oral Daily  . [START ON 10/10/2013] fentaNYL  25 mcg Transdermal Q72H  . furosemide  20 mg Oral BID  . heparin subcutaneous  5,000 Units Subcutaneous 3 times per day  . latanoprost  1 drop Both Eyes QHS  . levofloxacin (LEVAQUIN) IV  500 mg Intravenous Q48H  . pantoprazole  40 mg Oral Q1200  . rOPINIRole  1 mg Oral QHS  . sodium chloride  3 mL Intravenous Q12H  . traZODone  50 mg  Oral QHS   Continuous Infusions:   Principal Problem:   Acute diastolic CHF (congestive heart failure) Active Problems:   Severe aortic stenosis   Protein malnutrition   CKD (chronic kidney disease), stage III   Anemia   Spinal stenosis   Diarrhea   Cardiomyopathy    Time spent: 40 mins    [facility] MD [facility] Pager [phone]. If 7PM-7AM, please contact night-coverage at [email or web address], password [facility] 10/09/2013, 5:37 PM  LOS: 2 days

## 2013-10-09 NOTE — Progress Notes (Signed)
Clinical Social Work Department CLINICAL SOCIAL WORK PLACEMENT NOTE 10/09/2013  Patient:  Candice Barker, Candice Barker  Account Number:  [id number] Admit date:  10/07/2013  Clinical Social Worker:  Orpah Greek  Date/time:  10/09/2013 06:57 PM  Clinical Social Work is seeking post-discharge placement for this patient at the following level of care:   SKILLED NURSING   (*CSW will update this form in Epic as items are completed)   10/09/2013  Patient/family provided with [facility] Department of Clinical Social Work's list of facilities offering this level of care within the geographic area requested by the patient (or if unable, by the patient's family).  10/09/2013  Patient/family informed of their freedom to choose among providers that offer the needed level of care, that participate in Medicare, Medicaid or managed care program needed by the patient, have an available bed and are willing to accept the patient.  10/09/2013  Patient/family informed of [facility]' ownership interest in [facility], as well as of the fact that they are under no obligation to receive care at this facility.  PASARR submitted to EDS on  PASARR number received on   FL2 transmitted to all facilities in geographic area requested by pt/family on  10/09/2013 FL2 transmitted to all facilities within larger geographic area on   Patient informed that his/her managed care company has contracts with or will negotiate with  certain facilities, including the following:     Patient/family informed of bed offers received:  10/09/2013 Patient chooses bed at  Physician recommends and patient chooses bed at    Patient to be transferred to  on   Patient to be transferred to facility by  Patient and family notified of transfer on  Name of family member notified:    The following physician request were entered in Epic:   Additional Comments:   Lincoln Maxin, LCSW [facility] Clinical  Social Worker cell #: [phone]

## 2013-10-10 DIAGNOSIS — F411 Generalized anxiety disorder: Secondary | ICD-10-CM

## 2013-10-10 DIAGNOSIS — Z515 Encounter for palliative care: Secondary | ICD-10-CM

## 2013-10-10 DIAGNOSIS — R0609 Other forms of dyspnea: Secondary | ICD-10-CM

## 2013-10-10 DIAGNOSIS — I959 Hypotension, unspecified: Secondary | ICD-10-CM

## 2013-10-10 DIAGNOSIS — R0600 Dyspnea, unspecified: Secondary | ICD-10-CM

## 2013-10-10 DIAGNOSIS — R0989 Other specified symptoms and signs involving the circulatory and respiratory systems: Secondary | ICD-10-CM

## 2013-10-10 LAB — BASIC METABOLIC PANEL
BUN: 64 mg/dL — AB (ref 6–23)
CO2: 21 mEq/L (ref 19–32)
CREATININE: 2.48 mg/dL — AB (ref 0.50–1.10)
Calcium: 8.2 mg/dL — ABNORMAL LOW (ref 8.4–10.5)
Chloride: 94 mEq/L — ABNORMAL LOW (ref 96–112)
GFR calc Af Amer: 18 mL/min — ABNORMAL LOW (ref >90)
GFR, EST NON AFRICAN AMERICAN: 16 mL/min — AB (ref >90)
Glucose, Bld: 117 mg/dL — ABNORMAL HIGH (ref 70–99)
Potassium: 4.4 mEq/L (ref 3.7–5.3)
Sodium: 134 mEq/L — ABNORMAL LOW (ref 137–147)

## 2013-10-10 LAB — CBC
HCT: 29.1 % — ABNORMAL LOW (ref 36.0–46.0)
Hemoglobin: 9.4 g/dL — ABNORMAL LOW (ref 12.0–15.0)
MCH: 29.8 pg (ref 26.0–34.0)
MCHC: 32.3 g/dL (ref 30.0–36.0)
MCV: 92.4 fL (ref 78.0–100.0)
Platelets: 222 10*3/uL (ref 150–400)
RBC: 3.15 MIL/uL — AB (ref 3.87–5.11)
RDW: 13.5 % (ref 11.5–15.5)
WBC: 7.2 10*3/uL (ref 4.0–10.5)

## 2013-10-10 MED ORDER — LORAZEPAM 1 MG PO TABS: 1.0000 mg | ORAL_TABLET | Freq: Four times a day (QID) | ORAL | Status: DC | PRN

## 2013-10-10 MED ORDER — MORPHINE SULFATE (CONCENTRATE) 10 MG /0.5 ML PO SOLN: 5.0000 mg | ORAL | Status: DC | PRN

## 2013-10-10 MED ORDER — VITAMINS A & D EX OINT
TOPICAL_OINTMENT | CUTANEOUS | Status: AC
Start: 2013-10-10 — End: 2013-10-10
  Administered 2013-10-10: 5
  Filled 2013-10-10: qty 5

## 2013-10-10 MED ORDER — SODIUM CHLORIDE 0.9 % IV BOLUS (SEPSIS)
250.0000 mL | Freq: Once | INTRAVENOUS | Status: AC
  Administered 2013-10-10: 250 mL via INTRAVENOUS

## 2013-10-10 MED ORDER — LORAZEPAM 1 MG PO TABS: 1.0000 mg | ORAL_TABLET | ORAL | Status: DC | PRN

## 2013-10-10 MED ORDER — SODIUM CHLORIDE 0.9 % IV BOLUS (SEPSIS)
500.0000 mL | Freq: Once | INTRAVENOUS | Status: AC
  Administered 2013-10-10: 500 mL via INTRAVENOUS

## 2013-10-10 NOTE — Progress Notes (Signed)
CSW met with patient, grandaughter-in-law, Pam & great grandaughter, Marissa at bedside re: discharge planning. Per Alinda @ [facility], they are unable to take patient back in her current weakened condition. CSW reviewed SNF bed offers - they toured [facility] though state that they were not pleased with that facility. RNCM met with family as well to discuss home with hospice services, private duty caregivers.   Awaiting decision from family re: discharge plan - SNF vs. Home with Hospice vs. Residential Hospice.   Raynaldo Opitz, [facility] Clinical Social Worker cell #: [phone]

## 2013-10-10 NOTE — Progress Notes (Signed)
CSW received call from patient's grandaughter-in-law, Pam that family has decided on [facility] SN[facility] afterall. Latasha @ [facility] & Dr. Janee Morn made aware.   Weekend CSW, Marchelle Folks (p#: [phone]) to facilitate discharge. Per Pam's request, so that she could be present at discharge - transport will be scheduled for 2:00pm pickup.   Lincoln Maxin, LCSW [facility] Clinical Social Worker cell #: [phone]

## 2013-10-10 NOTE — Progress Notes (Signed)
    Subjective:  Denies CP or dyspnea.   Objective:  Filed Vitals:   10/10/13 0131 10/10/13 0228 10/10/13 0405 10/10/13 0407  BP: 88/42 84/48  85/47  Pulse:    94  Temp:    97.6 F (36.4 C)  TempSrc:    Axillary  Resp:    20  Height:      Weight:   133 lb 9.6 oz (60.6 kg)   SpO2:    96%    Intake/Output from previous day:  Intake/Output Summary (Last 24 hours) at 10/10/13 0809 Last data filed at 10/10/13 0238  Gross per 24 hour  Intake    600 ml  Output    800 ml  Net   -200 ml    Physical Exam: Physical exam: Well-developed frail in no acute distress.  Skin is warm and dry.  HEENT is normal.  Neck is supple.  Chest with basilar crackles Cardiovascular exam is regular rate and rhythm. 3/6 systolic murmur left sternal border. S2 is diminished. Abdominal exam nontender or distended. No masses palpated. Extremities show no edema. neuro grossly intact    Lab Results: Basic Metabolic Panel:  Recent Labs  35/82/51 1208 10/09/13 0404 10/10/13 0329  NA 139 137 134*  K 4.7 4.6 4.4  CL 97 94* 94*  CO2 23 24 21   GLUCOSE 85 112* 117*  BUN 45* 50* 64*  CREATININE 2.16* 2.35* 2.48*  CALCIUM 9.0 8.9 8.2*  MG 1.7  --   --    CBC:  Recent Labs  10/07/13 1418 10/08/13 1208 10/10/13 0329  WBC 7.2 7.7 7.2  NEUTROABS 5.9  --   --   HGB 8.6* 9.8* 9.4*  HCT 26.6* 31.0* 29.1*  MCV 93.0 92.8 92.4  PLT 207 249 222   Cardiac Enzymes:  Recent Labs  10/07/13 2012 10/08/13 0120 10/08/13 0810  TROPONINI <0.30 0.31* <0.30     Assessment/Plan:  1 severe aortic stenosis-the patient has decreased LV function and critical aortic stenosis. She presented with pulmonary edema. Her dyspnea/hypoxia had improved with diuresis but she is now hypotensive. Her renal function has deteriorated. She is presently receiving a small saline bolus. Hold diuretics for now. Patient and her grandson have discussed her medical issues. She does not want invasive procedures. Patient  understands that aortic stenosis will eventually take her life. I think this decision is appropriate. Palliative care consult is planned. Would resume Lasix 20 mg daily when blood pressure and renal function improved. Prognosis is poor. 2 cardiomyopathy-I would not add a beta blocker as her blood pressure is borderline and she has critical aortic stenosis. Not a candidate for ACE inhibitor given borderline blood pressure and renal insufficiency. 3 acute on chronic renal failure Cardiology will sign off. Palliative care consult is appropriate.  Olga Millers 10/10/2013, 8:09 AM

## 2013-10-10 NOTE — Progress Notes (Signed)
CARE MANAGEMENT NOTE 10/10/2013  Patient:  Candice Barker, Candice Barker   Account Number:  [id number]  Date Initiated:  10/10/2013  Documentation initiated by:  Lanier Clam  Subjective/Objective Assessment:   80 Y/O F ADMITTED W/CHF.     Action/Plan:   FROM [facility]   Anticipated DC Date:  10/11/2013   Anticipated DC Plan:  SKILLED NURSING FACILITY      DC Planning Services  CM consult      Choice offered to / List presented to:  C-4 Adult Children           Status of service:  In process, will continue to follow Medicare Important Message given?   (If response is "NO", the following Medicare IM given date fields will be blank) Date Medicare IM given:   Date Additional Medicare IM given:    Discharge Disposition:    Per UR Regulation:  Reviewed for med. necessity/level of care/duration of stay  If discussed at Long Length of Stay Meetings, dates discussed:    Comments:  10/10/13 KATHY MAHABIR RN,BSN NCM [phone] 5:30P-PER CSW-NOW D/C PLAN FOR SNF IN AM.  1P-RECEIVED REFERRAL FOR HOME HOSPICE CHOICE.SPOKE TO GRAND DTR IN LAW-PAM ABOUT CHOICE-CHOSE [facility]-SEVERAL CONVERSATIONS W/[facility] ABOUT SERVICES-MARGIE-[facility],DIR OF REFERRAL SERVICES-THERE IS MORE INFO NEEDED FROM PATIENT-AN ATTENDING PHYSICIAN,ELIGIBILITY.PAM WAS UNDECIDED ABOUT SNF/HOME W/HOSPICE.MD UPDATED.

## 2013-10-10 NOTE — Consult Note (Signed)
Patient ZO:[id number] Barba      DOB: 1917/03/28      WUJ:[id number]     Consult Note from the Palliative Medicine Team at [facility]    Consult Requested by: Dr Janee Morn     PCP: Florentina Jenny, MD Reason for Consultation: Clarification of GOC and options     Phone Number:[phone]  Assessment of patients Current state: Candice Barker is a 80 y.o. female with prior h/o, severe AS,  hypertension, CHF unknown EF, recently diagnosed with pneumonia, with  persistent symptoms, brought to ED and admiited from ALF by her grand daughter. Per pt reports she cannot lay flat and has sob.  Medical interventions for stabilization,   Patient is now  faced with advanced directive decisions and anticipatory care needs    Consult is for review of medical treatment options, clarification of goals of care and end of life issues, disposition, options, and symptom recommendation.  This NP Lorinda Creed reviewed medical records, received report from team, assessed the patient and then meet at the patient's bedside along with her step-grand daughter Elita Quick  to discuss diagnosis prognosis, GOC, EOL wishes disposition and options.  A detailed discussion was had today regarding advanced directives.  Concepts specific to code status, artifical feeding and hydration, continued IV antibiotics and rehospitalization was had.  The difference between a aggressive medical intervention path  and a palliative comfort care path for this patient at this time was had.  Values and goals of care important to patient and family were attempted to be elicited.  Concept of Hospice and Palliative Care were discussed  Natural trajectory and expectations at EOL were discussed.  Questions and concerns addressed.  Family encouraged to call with questions or concerns.  PMT will continue to support holistically.   Goals of Care: 1.  Code Status: DNR/DNI-comfort is main focus of care Patient states "I hope to close my eyes tonight and go on"    2. Scope of Treatment: 1. Vital Signs:daily  2. Respiratory/Oxygen:as needed for comfort 3. Nutritional Support/Tube Feeds:no artifical feeding now or in the future 4. Antibiotics:make decision if/when infection occurs 5. Blood Products:none 6. IVF: KVO for medications only, no further fluid bolus for resuscitation  7. Review of Medications to be discontinued: minimize for comfort 8. Labs: none 9. Telemetry: none    3. Disposition: Hopefully back to AL with hospice services in place.  If patient transitions to SNF, recommend hospice services as quickly as possible.  If patient decompensates before discharge in the morning, make referral to residential hospice [facility])   4. Symptom Management:   1. Anxiety/Agitation:  Ativan 1 mg PO every 4 hrs prn 2. Pain/Dyspnea  Roxanol 5 mg PO every 1 hr prn    5. Psychosocial:  Emotional support offered to patient and family at bedsdie      Patient Documents Completed or Given: Document Given Completed  Advanced Directives Pkt    MOST  yes  DNR    Gone from My Sight    Hard Choices      Brief HPI: Candice Barker is a 80 y.o. female with prior h/o, severe AS,  hypertension, CHF unknown EF, recently diagnosed with pneumonia, was started on Levaquin and later on switched to augmentin for persistent symptoms was brought in from ALF by her grand daughter. Per pt reports she cannot lay flat and has sob. She reports sob on exertion, and cough with mild productive sputum.  On arrival to ED, she underwent CXR revealing CHF  picture. Her pro bnp is elevated and lab work reveal mild hyponatremia and baseline renal function of CKD stage 3. Her UA is clear of infection.   Patient has stabilized and now is facing anticipatory care needs in a desired comfort care path  ROS: weakness, SOB on exertion   PMH:  Past Medical History  Diagnosis Date  . Heart failure   . Spinal stenosis   . Edema   . Glaucoma   . Diabetes mellitus  without complication   . GERD (gastroesophageal reflux disease)   . Depression   . Bronchitis   . Endocarditis   . Cardiomyopathy 10/09/2013     PSH: Past Surgical History  Procedure Laterality Date  . Appendectomy    . Knee surgery    . Bilateral catract extraction     I have reviewed the FH and SH and  If appropriate update it with new information. No Known Allergies Scheduled Meds: . acidophilus  1 capsule Oral Daily  . antiseptic oral rinse  15 mL Mouth Rinse BID  . aspirin  81 mg Oral Daily  . fentaNYL  25 mcg Transdermal Q72H  . heparin subcutaneous  5,000 Units Subcutaneous 3 times per day  . latanoprost  1 drop Both Eyes QHS  . levofloxacin (LEVAQUIN) IV  500 mg Intravenous Q48H  . pantoprazole  40 mg Oral Q1200  . rOPINIRole  1 mg Oral QHS  . sodium chloride  3 mL Intravenous Q12H  . traZODone  50 mg Oral QHS   Continuous Infusions:  PRN Meds:.sodium chloride, hydrOXYzine, nitroGLYCERIN, promethazine, sodium chloride    BP 83/50  Pulse 94  Temp(Src) 97.6 F (36.4 C) (Axillary)  Resp 20  Ht 5\' 3"  (1.6 m)  Wt 60.6 kg (133 lb 9.6 oz)  BMI 23.67 kg/m2  SpO2 96%   PPS: 40 %   Intake/Output Summary (Last 24 hours) at 10/10/13 1141 Last data filed at 10/10/13 0238  Gross per 24 hour  Intake    400 ml  Output    550 ml  Net   -150 ml    Physical Exam:  General: chronically ill appearing NAD,  HEENT:  Moist buccal membranes, no exudate Chest:  Decreased in bases CVS: RRR, noted murmur Abdomen: soft NT +BS Ext: without edema Neuro: alert and oriented, engaged in conversation and has good insight into her situation   Labs: CBC    Component Value Date/Time   WBC 7.2 10/10/2013 0329   RBC 3.15* 10/10/2013 0329   HGB 9.4* 10/10/2013 0329   HCT 29.1* 10/10/2013 0329   PLT 222 10/10/2013 0329   MCV 92.4 10/10/2013 0329   MCH 29.8 10/10/2013 0329   MCHC 32.3 10/10/2013 0329   RDW 13.5 10/10/2013 0329   LYMPHSABS 0.7 10/07/2013 1418   MONOABS 0.5 10/07/2013  1418   EOSABS 0.0 10/07/2013 1418   BASOSABS 0.0 10/07/2013 1418    BMET    Component Value Date/Time   NA 134* 10/10/2013 0329   K 4.4 10/10/2013 0329   CL 94* 10/10/2013 0329   CO2 21 10/10/2013 0329   GLUCOSE 117* 10/10/2013 0329   BUN 64* 10/10/2013 0329   CREATININE 2.48* 10/10/2013 0329   CALCIUM 8.2* 10/10/2013 0329   GFRNONAA 16* 10/10/2013 0329   GFRAA 18* 10/10/2013 0329    CMP     Component Value Date/Time   NA 134* 10/10/2013 0329   K 4.4 10/10/2013 0329   CL 94* 10/10/2013 0329   CO2 21 10/10/2013  0329   GLUCOSE 117* 10/10/2013 0329   BUN 64* 10/10/2013 0329   CREATININE 2.48* 10/10/2013 0329   CALCIUM 8.2* 10/10/2013 0329   PROT 7.3 10/07/2013 1418   ALBUMIN 4.0 10/07/2013 1418   AST 12 10/07/2013 1418   ALT 7 10/07/2013 1418   ALKPHOS 62 10/07/2013 1418   BILITOT 0.2* 10/07/2013 1418   GFRNONAA 16* 10/10/2013 0329   GFRAA 18* 10/10/2013 0329      Time In Time Out Total Time Spent with Patient Total Overall Time  1130 1300 80 min 90 min    Greater than 50%  of this time was spent counseling and coordinating care related to the above assessment and plan.   Lorinda Creed NP  Palliative Medicine Team Team Phone # [phone] Pager [phone]  Discussed with Dr Janee Morn

## 2013-10-10 NOTE — Progress Notes (Signed)
[facility] PROGRESS NOTE  Candice Barker HYQ:[id number] DOB: 10/02/1916 DOA: 10/07/2013 PCP: Florentina JennyRIPP, HENRY, MD  Assessment/Plan: #1 acute systolic and diastolic CHF exacerbation Patient with clinical improvement. Likely secondary to severe aortic stenosis noted on 2-D echo. Cardiac enzymes which was cycled had one enzyme which was slightly elevated at 0.31 otherwise rest with negative x2. Patient denies any chest pain. EKG with no significant change since prior EKG. I/O. = -3.18 L. Patient with borderline blood pressure. Patient also with chronic kidney disease and rising creatinine. IV Lasix has been changed to oral Lasix per cardiology. WPro BNP on admission was [id number]. Will decrease IV Lasix to 20 mg IV every 12 hours. Continue aspirin. 2-D echo with EF of 25-30% and severe aortic stenosis with valve area of 0.51 cm. Pulmonary artery systolic pressure was severely increased with PA pressure of 71 mmHg. patient noted to be hypotensive this morning with increasing creatinine. Will hold Lasix. Unable to place on a beta blocker secondary to borderline blood pressure. Patient not an ACE inhibitor candidate secondary to borderline blood pressure in chronic kidney disease. Once renal function is improved her blood pressure improved we'll place on Lasix 20 mg daily as per cardiology recommendations. Cardiology following and appreciate input and recommendations.  #2 critical severe aortic stenosis/cardiomyopathy Per 2-D echo patient also with a severely depressed left ventricular function and critical aortic stenosis via phone 25-30%. Valve area of 0.51 cm. Patient has been seen by cardiology and also discussion about possible balloon valvuloplasty. Due to patient's age is not a candidate for aortic valve replacement. Renal function has deteriorated with some diuresis as well. Patient at this point in time is not inclined to have any further invasive procedures. This was discussed with the grandson who is  also in agreement. Continue medical management per cardiology for some symptomatic relief. Palliative care consult for goals of care pending.   #3 ?? Resolving pneumonia. Patient was on antibiotics prior to admission. Patient has been started back on Levaquin will treat for 5 days to complete a course.  #4 hypotension Likely secondary to diuresis. Will hold Lasix. Fluid bolus. Follow.  #4 diarrhea C. difficile PCR is negative. Follow.  #5. CKD stage 3-4 Stable. Creatinine slowly rising. Patient noted to be hypotensive overnight and early this morning. Will hold Lasix. Fluid bolus. Follow.  #6 anemia Likely anemia of chronic disease. Follow H&H. No overt signs of bleeding.  #7 spinal stenosis Continue home pain regimen.  #8 prophylaxis PPI for GI prophylaxis. Heparin for DVT prophylaxis.  #9 prognosis Patient is presenting with symptomatic severe critical aortic stenosis with cardiomyopathy and severely depressed left ventricular function. Patient with borderline blood pressure and a such a be placed on a beta blocker or ACE inhibitor secondary to chronic kidney disease. Patient due to her age is not a candidate for aortic valve replacement. Patient at this time does not want to proceed with any invasive procedures. Will continue diuresis and medical management as per cardiology for some symptomatic relief. Palliative care consult pending for goals of care.  Code Status: DO NOT RESUSCITATE Family Communication: Updated patient no family at bedside. Disposition Plan: Back to SNF when medically stable.   Consultants:  Cardiology: Dr. Allyson SabalBerry 10/08/2013  Procedures:  Chest x-ray 10/07/2013  2-D echo 10/08/2013  Antibiotics:  Levaquin 10/07/2013  HPI/Subjective: Patient states shortness of breath has improved since admission and is feeling better. Patient denies any diarrhea. Patient noted to be hypotensive overnight and this morning.   Objective: Filed Vitals:  10/10/13  0407  BP: 85/47  Pulse: 94  Temp: 97.6 F (36.4 C)  Resp: 20    Intake/Output Summary (Last 24 hours) at 10/10/13 0949 Last data filed at 10/10/13 0238  Gross per 24 hour  Intake    400 ml  Output    650 ml  Net   -250 ml   Filed Weights   10/08/13 0447 10/09/13 0438 10/10/13 0405  Weight: 60.691 kg (133 lb 12.8 oz) 58.6 kg (129 lb 3 oz) 60.6 kg (133 lb 9.6 oz)    Exam:   General:  NAD  Cardiovascular: RRR with 3/6 sem left sternal border  Respiratory: Decreased breath sounds in the bases. Bibasilar crackles.  Abdomen: Soft, nontender, nondistended, positive bowel sounds.  Musculoskeletal: No clubbing no cyanosis, edema.  Data Reviewed: Basic Metabolic Panel:  Recent Labs Lab 10/07/13 1418 10/08/13 1208 10/09/13 0404 10/10/13 0329  NA 134* 139 137 134*  K 4.8 4.7 4.6 4.4  CL 97 97 94* 94*  CO2 19 23 24 21   GLUCOSE 93 85 112* 117*  BUN 38* 45* 50* 64*  CREATININE 1.96* 2.16* 2.35* 2.48*  CALCIUM 8.6 9.0 8.9 8.2*  MG  --  1.7  --   --    Liver Function Tests:  Recent Labs Lab 10/07/13 1418  AST 12  ALT 7  ALKPHOS 62  BILITOT 0.2*  PROT 7.3  ALBUMIN 4.0   No results found for this basename: LIPASE, AMYLASE,  in the last 168 hours No results found for this basename: AMMONIA,  in the last 168 hours CBC:  Recent Labs Lab 10/07/13 1418 10/08/13 1208 10/10/13 0329  WBC 7.2 7.7 7.2  NEUTROABS 5.9  --   --   HGB 8.6* 9.8* 9.4*  HCT 26.6* 31.0* 29.1*  MCV 93.0 92.8 92.4  PLT 207 249 222   Cardiac Enzymes:  Recent Labs Lab 10/07/13 2012 10/08/13 0120 10/08/13 0810  TROPONINI <0.30 0.31* <0.30   BNP (last 3 results)  Recent Labs  10/07/13 1418 10/09/13 0404  PROBNP 44135.0* 52186.0*   CBG: No results found for this basename: GLUCAP,  in the last 168 hours  Recent Results (from the past 240 hour(s))  MRSA PCR SCREENING     Status: None   Collection Time    10/07/13  5:13 PM      Result Value Ref Range Status   MRSA by PCR  NEGATIVE  NEGATIVE Final   Comment:            The GeneXpert MRSA Assay (FDA     approved for NASAL specimens     only), is one component of a     comprehensive MRSA colonization     surveillance program. It is not     intended to diagnose MRSA     infection nor to guide or     monitor treatment for     MRSA infections.  CLOSTRIDIUM DIFFICILE BY PCR     Status: None   Collection Time    10/08/13 10:50 AM      Result Value Ref Range Status   C difficile by pcr NEGATIVE  NEGATIVE Final   Comment: Performed at [facility]     Studies: No results found.  Scheduled Meds: . acidophilus  1 capsule Oral Daily  . antiseptic oral rinse  15 mL Mouth Rinse BID  . aspirin  81 mg Oral Daily  . fentaNYL  25 mcg Transdermal Q72H  . heparin subcutaneous  5,000 Units Subcutaneous 3 times per day  . latanoprost  1 drop Both Eyes QHS  . levofloxacin (LEVAQUIN) IV  500 mg Intravenous Q48H  . pantoprazole  40 mg Oral Q1200  . rOPINIRole  1 mg Oral QHS  . sodium chloride  500 mL Intravenous Once  . sodium chloride  3 mL Intravenous Q12H  . traZODone  50 mg Oral QHS   Continuous Infusions:   Principal Problem:   Acute diastolic CHF (congestive heart failure) Active Problems:   Severe aortic stenosis   Protein malnutrition   CKD (chronic kidney disease), stage III   Anemia   Spinal stenosis   Diarrhea   Cardiomyopathy   Hypotension, unspecified    Time spent: 40 mins    [facility] MD [facility] Pager [phone]. If 7PM-7AM, please contact night-coverage at [email or web address], password [facility] 10/10/2013, 9:49 AM  LOS: 3 days

## 2013-10-10 NOTE — Progress Notes (Signed)
Upon initial assessment, pt very lethargic, very difficult to awaken. When vital signs were taken, BP very low. Pt symptomatic- stating, "I don't feel very good." Manual BP also taken. MD notified. Pt put in Trendelenburg position per MD request. New orders were given. BP slightly better now, will continue to monitor closely. Fraser Din, RN

## 2013-10-11 MED ORDER — FUROSEMIDE 20 MG PO TABS: 20.0000 mg | ORAL_TABLET | Freq: Every day | ORAL | Status: DC

## 2013-10-11 MED ORDER — FENTANYL 25 MCG/HR TD PT72: 25.0000 ug | MEDICATED_PATCH | TRANSDERMAL | Status: DC

## 2013-10-11 MED ORDER — HYDROXYZINE HCL 10 MG PO TABS: 10.0000 mg | ORAL_TABLET | Freq: Four times a day (QID) | ORAL | Status: DC | PRN

## 2013-10-11 MED ORDER — LORAZEPAM 1 MG PO TABS: 1.0000 mg | ORAL_TABLET | ORAL | Status: DC | PRN

## 2013-10-11 MED ORDER — MORPHINE SULFATE (CONCENTRATE) 10 MG /0.5 ML PO SOLN: 5.0000 mg | ORAL | Status: DC | PRN

## 2013-10-11 MED ORDER — LEVOFLOXACIN 500 MG PO TABS
500.0000 mg | ORAL_TABLET | ORAL | Status: DC
  Administered 2013-10-11: 500 mg via ORAL
  Filled 2013-10-11: qty 1

## 2013-10-11 MED ORDER — ONDANSETRON HCL 4 MG/5ML PO SOLN: 4.0000 mg | Freq: Three times a day (TID) | ORAL | Status: DC | PRN

## 2013-10-11 NOTE — Progress Notes (Signed)
Gave verbal phone report to receiving nurse Foye Clock @ 0201pm. At [facility]. Awaiting for Ambulance transfer to transport the patient. Report appreciated and completed.

## 2013-10-11 NOTE — Progress Notes (Signed)
Per MD, Pt ready for d/c.  Notified RN, family and facility.  Sent d/c summary.  Confirmed receipt of d/c summary.  Facility ready to receive Pt.  Arranged for transportation.  Amanda Simpson, LCSWA Clinical Social Work [phone]  

## 2013-10-11 NOTE — Progress Notes (Signed)
PHARMACIST - PHYSICIAN COMMUNICATION DR:   [facility] CONCERNING: Antibiotic IV to Oral Route Change Policy  RECOMMENDATION: This patient is receiving Levofloxacin by the intravenous route.  Based on criteria approved by the Pharmacy and Therapeutics Committee, the antibiotic(s) is/are being converted to the equivalent oral dose form(s).   DESCRIPTION: These criteria include:  Patient being treated for a respiratory tract infection, urinary tract infection, cellulitis or clostridium difficile associated diarrhea if on metronidazole  The patient is not neutropenic and does not exhibit a GI malabsorption state  The patient is eating (either orally or via tube) and/or has been taking other orally administered medications for a least 24 hours  The patient is improving clinically and has a Tmax < 100.5  Today is D#5 levofloxacin at [facility]  If you have questions about this conversion, please contact the Pharmacy Department  []   [phone] )  [facility] []   [phone] )  [facility]  []   [phone] )  [facility] [x]   [phone] )  Ilene Qua   Juliette Alcide, PharmD, BCPS.   Pager: [phone] 10/11/2013 11:05 AM

## 2013-10-11 NOTE — Discharge Summary (Signed)
Physician Discharge Summary  Kathe Campa BTD:[id number] DOB: 07/08/1916 DOA: 10/07/2013  PCP: Florentina Jenny, MD  Admit date: 10/07/2013 Discharge date: 10/11/2013  Time spent: 65 minutes  Recommendations for Outpatient Follow-up:  1. Patient will be discharged to a skilled nursing facility with palliative care following. Followup with M.D. at the skilled nursing facility. Patient will benefit with transition to hospice services as quickly as possible once in the skilled nursing facility. 2. Followup with Florentina Jenny, MD in 1 week.  Discharge Diagnoses:  Principal Problem:   Acute diastolic CHF (congestive heart failure) Active Problems:   Severe aortic stenosis   Protein malnutrition   CKD (chronic kidney disease), stage III   Anemia   Spinal stenosis   Diarrhea   Cardiomyopathy   Hypotension, unspecified   Palliative care encounter   Dyspnea   Anxiety state, unspecified   Discharge Condition: Stable and improved Diet recommendation: Heart healthy  Filed Weights   10/09/13 0438 10/10/13 0405 10/11/13 0538  Weight: 58.6 kg (129 lb 3 oz) 60.6 kg (133 lb 9.6 oz) 60.646 kg (133 lb 11.2 oz)    History of present illness:  Candice Barker is a 80 y.o. female with prior h/o hypertension, CHF unknown EF, recently diagnosed with pneumonia, was started on Levaquin and later on switched to augmentin for persistent symptoms was brought in from ALF by her grand daughter. On further questioning, pt reports she cannot lay flat and has sob and thought she had pneumonia. She reports sob on exertion, and cough with mild productive sputum. She also reports seeing scanty pink speckled sputum occasionally. She denies fevers, has cold clammy extremities everyday. She denies syncope or palpitations. She denies dizziness. On arrival to ED, she underwent CXR revealing CHF picture. Her pro bnp is elevated and lab work reveal mild hyponatremia and baseline renal function of CKD stage 3. Her UA is clear of  infection. She is referred to medical service for admission for possible CHF exacerbation. She also reports pedal edema, puffiness of the face and abdominal distention.      Hospital Course:  #1 acute systolic and diastolic CHF exacerbation  Patient with clinical improvement. Likely secondary to severe aortic stenosis noted on 2-D echo. Cardiac enzymes which was cycled had one enzyme which was slightly elevated at 0.31 otherwise rest with negative x2. Patient denies any chest pain. EKG with no significant change since prior EKG. I/O. = -2.330 L. Patient with borderline blood pressure. Patient also with chronic kidney disease and rising creatinine. IV Lasix has been changed to oral Lasix per cardiology. WPro BNP on admission was 36468. Will decrease IV Lasix to 20 mg IV every 12 hours. Continue aspirin. 2-D echo with EF of 25-30% and severe aortic stenosis with valve area of 0.51 cm. Pulmonary artery systolic pressure was severely increased with PA pressure of 71 mmHg. patient noted to be hypotensive with increasing creatinine. Patient's Lasix were held. Unable to place on a beta blocker secondary to borderline blood pressure. Patient not an ACE inhibitor candidate secondary to borderline blood pressure in chronic kidney disease. Once renal function is improved her blood pressure improved will place on Lasix 20 mg daily as per cardiology recommendations, when blood pressure improve and renal function has improved. Patient is being discharged on Lasix 20 mg daily to hold if systolic blood pressure less than 110. #2 critical severe aortic stenosis/cardiomyopathy  Per 2-D echo patient also with a severely depressed left ventricular function and critical aortic stenosis via phone 25-30%. Valve area  of 0.51 cm. Patient has been seen by cardiology and also discussion about possible balloon valvuloplasty. Due to patient's age is not a candidate for aortic valve replacement. Renal function has deteriorated with  some diuresis as well. Patient at this point in time is not inclined to have any further invasive procedures. This was discussed with the grandson who is also in agreement. Continue medical management per cardiology for some symptomatic relief. Palliative care was consulted for goals of care. Patient was seen by palliative care. Patient was made DO NOT RESUSCITATE and main focus of care with comfort. This was placed on oxygen as needed for comfort. No artificial feeding now or in the future. It was felt that the family and patient and make a decision as to whether to use antibiotics to treat infection. Patient does not want any blood products. IV fluids at [facility] LPKVO. Patient was placed on Ativan for agitation and anxiety as well as Roxanol for pain/dyspnea. Patient will be discharged to a skilled nursing facility with palliative care following. Was also recommended that patient be transitioned to hospice services as quickly as possible was she transitions to the SNF. #3 ?? Resolving pneumonia.  Patient was on antibiotics prior to admission. Patient was started back on Levaquin and treated for 5 days to complete a course.  #4 hypotension  Likely secondary to diuresis. Lasix was held. Patient was given some fluid boluses with some improvement in her blood pressure.  #4 diarrhea  Patient was noted on admission to have diarrhea. Patient did not have any further loose stools. C. difficile PCR was negative. Follow.  #5. CKD stage 3-4 vs acute on chronic kidney disease stage 3-4 Stable. Creatinine slowly rising. Patient noted to be hypotensive 2 days ago. Patient's diuretics were discontinued. Patient was also bolused with IV fluids. This needs to be followed up upon as outpatient. overnight and early this morning. Continue to hold Lasix. Will hold Lasix. Fluid bolus. Follow.  #6 anemia  Likely anemia of chronic disease. Follow H&H. No overt signs of bleeding.  #7 spinal stenosis  Continued on home pain regimen.  #8  prophylaxis  PPI for GI prophylaxis. Heparin for DVT prophylaxis.  #9 prognosis  Patient is presenting with symptomatic severe critical aortic stenosis with cardiomyopathy and severely depressed left ventricular function. Patient with borderline blood pressure and a such a be placed on a beta blocker or ACE inhibitor secondary to chronic kidney disease. Patient due to her age is not a candidate for aortic valve replacement. Patient at this time does not want to proceed with any invasive procedures. Will continue diuresis and medical management as per cardiology for some symptomatic relief. Patient has been seen by palliative care   Procedures: Chest x-ray 10/07/2013  2-D echo 10/08/2013     Consultations: Cardiology: Dr. Allyson SabalBerry 10/08/2013 Palliative care medicine: Lorinda CreedMary Larach, NP 10/10/2013   Discharge Exam: Filed Vitals:   10/11/13 0524  BP: 93/59  Pulse: 87  Temp: 97.9 F (36.6 C)  Resp: 18    General: NAD Cardiovascular: Regular rate rhythm no murmurs rubs or gallops Respiratory: Decreased bibasilar crackles.  Discharge Instructions You were cared for by a hospitalist during your hospital stay. If you have any questions about your discharge medications or the care you received while you were in the hospital after you are discharged, you can call the unit and asked to speak with the hospitalist on call if the hospitalist that took care of you is not available. Once you are discharged, your  primary care physician will handle any further medical issues. Please note that NO REFILLS for any discharge medications will be authorized once you are discharged, as it is imperative that you return to your primary care physician (or establish a relationship with a primary care physician if you do not have one) for your aftercare needs so that they can reassess your need for medications and monitor your lab values.      Discharge Instructions   Diet - low sodium heart healthy    Complete  by:  As directed      Discharge instructions    Complete by:  As directed   Follow up with MD at SNF.     Increase activity slowly    Complete by:  As directed             Medication List    STOP taking these medications       acetaminophen 500 MG tablet  Commonly known as:  TYLENOL     KALEXATE Powd     oxycodone-acetaminophen 2.5-325 MG per tablet  Commonly known as:  PERCOCET      TAKE these medications       acidophilus Caps capsule  Take 1 capsule by mouth daily.     aspirin 81 MG tablet  Take 81 mg by mouth daily.     calcium carbonate 500 MG chewable tablet  Commonly known as:  TUMS - dosed in mg elemental calcium  Chew 1 tablet by mouth every 4 (four) hours as needed for indigestion or heartburn.     fentaNYL 25 MCG/HR patch  Commonly known as:  DURAGESIC  Place 1 patch (25 mcg total) onto the skin every 3 (three) days.     furosemide 20 MG tablet  Commonly known as:  LASIX  Take 1 tablet (20 mg total) by mouth daily. hOLD FOR sbp < 110     hydrOXYzine 10 MG tablet  Commonly known as:  ATARAX/VISTARIL  Take 1 tablet (10 mg total) by mouth every 6 (six) hours as needed for itching or anxiety.     latanoprost 0.005 % ophthalmic solution  Commonly known as:  XALATAN  Place 1 drop into both eyes at bedtime.     LORazepam 1 MG tablet  Commonly known as:  ATIVAN  Take 1 tablet (1 mg total) by mouth every 4 (four) hours as needed for anxiety.     morphine CONCENTRATE 10 mg / 0.5 ml concentrated solution  Take 0.25 mLs (5 mg total) by mouth every 2 (two) hours as needed for moderate pain or shortness of breath.     nitroGLYCERIN 0.4 MG SL tablet  Commonly known as:  NITROSTAT  Place 0.4 mg under the tongue every 5 (five) minutes x 3 doses as needed. For chest pain     nystatin 100000 UNIT/GM Powd  Apply 15 g topically every evening.     omeprazole 20 MG capsule  Commonly known as:  PRILOSEC  Take 20 mg by mouth daily.     ondansetron 4 MG/5ML  solution  Commonly known as:  ZOFRAN  Take 5 mLs (4 mg total) by mouth every 8 (eight) hours as needed for nausea or vomiting.     polyethylene glycol packet  Commonly known as:  MIRALAX / GLYCOLAX  Take 17 g by mouth daily as needed for moderate constipation.     psyllium 0.52 G capsule  Commonly known as:  REGULOID  Take 1.04 g by mouth at bedtime.  rOPINIRole 1 MG tablet  Commonly known as:  REQUIP  Take 1 mg by mouth at bedtime.     senna-docusate 8.6-50 MG per tablet  Commonly known as:  Senokot-S  Take 1 tablet by mouth at bedtime.     traZODone 50 MG tablet  Commonly known as:  DESYREL  Take 50 mg by mouth at bedtime.       No Known Allergies Follow-up Information   Follow up with Florentina Jenny, MD. Schedule an appointment as soon as possible for a visit in 1 week.   Specialty:  Family Medicine   Contact information:   [address] [place] [address] [phone]       Schedule an appointment as soon as possible for a visit in 1 week to follow up. (f/u with MD at [facility])        The results of significant diagnostics from this hospitalization (including imaging, microbiology, ancillary and laboratory) are listed below for reference.    Significant Diagnostic Studies: Dg Chest 2 View  10/07/2013   CLINICAL DATA:  Recently diagnosis with right lower lobe pneumonia. Persistent chest discomfort, congestion, cough and shortness of breath.  EXAM: CHEST  2 VIEW  COMPARISON:  Chest x-ray 05/30/2012.  FINDINGS: Bibasilar opacities may reflect areas of atelectasis and/or consolidation. Probable small left pleural effusion. In addition, there is some cephalization of the pulmonary vasculature with a background of indistinct interstitial markings and some Kerley B-lines in the lower right long, indicative of a background of mild interstitial pulmonary edema. Mild cardiomegaly. The patient is rotated to the left on today's exam, resulting in distortion of the  mediastinal contours and reduced diagnostic sensitivity and specificity for mediastinal pathology. Atherosclerosis in the thoracic aorta.  IMPRESSION: 1. Bibasilar opacities may reflect areas of atelectasis and/or consolidation in the lower lobes of the lungs bilaterally. 2. Mild cardiomegaly with evidence of mild interstitial pulmonary edema and small left pleural effusion. These findings suggest concurrent congestive heart failure. 3. Atherosclerosis.   Electronically Signed   By: Trudie Reed M.D.   On: 10/07/2013 15:08    Microbiology: Recent Results (from the past 240 hour(s))  MRSA PCR SCREENING     Status: None   Collection Time    10/07/13  5:13 PM      Result Value Ref Range Status   MRSA by PCR NEGATIVE  NEGATIVE Final   Comment:            The GeneXpert MRSA Assay (FDA     approved for NASAL specimens     only), is one component of a     comprehensive MRSA colonization     surveillance program. It is not     intended to diagnose MRSA     infection nor to guide or     monitor treatment for     MRSA infections.  CLOSTRIDIUM DIFFICILE BY PCR     Status: None   Collection Time    10/08/13 10:50 AM      Result Value Ref Range Status   C difficile by pcr NEGATIVE  NEGATIVE Final   Comment: Performed at [facility]     Labs: Basic Metabolic Panel:  Recent Labs Lab 10/07/13 1418 10/08/13 1208 10/09/13 0404 10/10/13 0329  NA 134* 139 137 134*  K 4.8 4.7 4.6 4.4  CL 97 97 94* 94*  CO2 19 23 24 21   GLUCOSE 93 85 112* 117*  BUN 38* 45* 50* 64*  CREATININE 1.96* 2.16* 2.35* 2.48*  CALCIUM 8.6 9.0 8.9 8.2*  MG  --  1.7  --   --    Liver Function Tests:  Recent Labs Lab 10/07/13 1418  AST 12  ALT 7  ALKPHOS 62  BILITOT 0.2*  PROT 7.3  ALBUMIN 4.0   No results found for this basename: LIPASE, AMYLASE,  in the last 168 hours No results found for this basename: AMMONIA,  in the last 168 hours CBC:  Recent Labs Lab 10/07/13 1418 10/08/13 1208  10/10/13 0329  WBC 7.2 7.7 7.2  NEUTROABS 5.9  --   --   HGB 8.6* 9.8* 9.4*  HCT 26.6* 31.0* 29.1*  MCV 93.0 92.8 92.4  PLT 207 249 222   Cardiac Enzymes:  Recent Labs Lab 10/07/13 2012 10/08/13 0120 10/08/13 0810  TROPONINI <0.30 0.31* <0.30   BNP: BNP (last 3 results)  Recent Labs  10/07/13 1418 10/09/13 0404  PROBNP 44135.0* 52186.0*   CBG: No results found for this basename: GLUCAP,  in the last 168 hours     Signed:  [facility] MD [facility] 10/11/2013, 12:15 PM

## 2013-10-13 NOTE — Consult Note (Signed)
I have reviewed and discussed the care of this patient in detail with the nurse practitioner including pertinent patient records, physical exam findings and data. I agree with details of this encounter.  

## 2013-10-13 NOTE — Progress Notes (Signed)
Clinical Social Work Department CLINICAL SOCIAL WORK PLACEMENT NOTE 10/13/2013  Patient:  KEATYN, AUJLA  Account Number:  [id number] Admit date:  10/07/2013  Clinical Social Worker:  Orpah Greek  Date/time:  10/09/2013 06:57 PM  Clinical Social Work is seeking post-discharge placement for this patient at the following level of care:   SKILLED NURSING   (*CSW will update this form in Epic as items are completed)   10/09/2013  Patient/family provided with [facility] Department of Clinical Social Work's list of facilities offering this level of care within the geographic area requested by the patient (or if unable, by the patient's family).  10/09/2013  Patient/family informed of their freedom to choose among providers that offer the needed level of care, that participate in Medicare, Medicaid or managed care program needed by the patient, have an available bed and are willing to accept the patient.  10/09/2013  Patient/family informed of [facility]' ownership interest in [facility], as well as of the fact that they are under no obligation to receive care at this facility.  PASARR submitted to EDS on 10/13/2013 PASARR number received on 10/13/2013  FL2 transmitted to all facilities in geographic area requested by pt/family on  10/09/2013 FL2 transmitted to all facilities within larger geographic area on   Patient informed that his/her managed care company has contracts with or will negotiate with  certain facilities, including the following:     Patient/family informed of bed offers received:  10/09/2013 Patient chooses bed at [facility] [place] Physician recommends and patient chooses bed at    Patient to be transferred to [facility] [place] on  10/13/2013 Patient to be transferred to facility by [facility] Patient and family notified of transfer on  Name of family member notified:    The following physician request were entered in  Epic:   Additional Comments:   Lincoln Maxin, LCSW [facility] Clinical Social Worker cell #: [phone]

## 2013-10-15 ENCOUNTER — Non-Acute Institutional Stay (SKILLED_NURSING_FACILITY): Payer: Medicare Other | Admitting: Internal Medicine

## 2013-10-15 DIAGNOSIS — I359 Nonrheumatic aortic valve disorder, unspecified: Secondary | ICD-10-CM

## 2013-10-15 DIAGNOSIS — I350 Nonrheumatic aortic (valve) stenosis: Secondary | ICD-10-CM

## 2013-10-15 DIAGNOSIS — I5031 Acute diastolic (congestive) heart failure: Secondary | ICD-10-CM

## 2013-10-15 DIAGNOSIS — E40 Kwashiorkor: Secondary | ICD-10-CM

## 2013-10-15 DIAGNOSIS — M48061 Spinal stenosis, lumbar region without neurogenic claudication: Secondary | ICD-10-CM

## 2013-10-15 DIAGNOSIS — N183 Chronic kidney disease, stage 3 unspecified: Secondary | ICD-10-CM

## 2013-10-15 DIAGNOSIS — E46 Unspecified protein-calorie malnutrition: Secondary | ICD-10-CM

## 2013-10-15 DIAGNOSIS — I509 Heart failure, unspecified: Secondary | ICD-10-CM

## 2013-11-04 ENCOUNTER — Non-Acute Institutional Stay (SKILLED_NURSING_FACILITY): Payer: Medicare Other | Admitting: Internal Medicine

## 2013-11-04 DIAGNOSIS — N183 Chronic kidney disease, stage 3 unspecified: Secondary | ICD-10-CM

## 2013-11-04 DIAGNOSIS — Z515 Encounter for palliative care: Secondary | ICD-10-CM

## 2013-11-04 DIAGNOSIS — M4804 Spinal stenosis, thoracic region: Secondary | ICD-10-CM

## 2013-11-04 DIAGNOSIS — F411 Generalized anxiety disorder: Secondary | ICD-10-CM

## 2013-11-04 DIAGNOSIS — I359 Nonrheumatic aortic valve disorder, unspecified: Secondary | ICD-10-CM

## 2013-11-04 DIAGNOSIS — K219 Gastro-esophageal reflux disease without esophagitis: Secondary | ICD-10-CM

## 2013-11-04 DIAGNOSIS — R197 Diarrhea, unspecified: Secondary | ICD-10-CM

## 2013-11-04 DIAGNOSIS — I5031 Acute diastolic (congestive) heart failure: Secondary | ICD-10-CM

## 2013-11-04 DIAGNOSIS — I350 Nonrheumatic aortic (valve) stenosis: Secondary | ICD-10-CM

## 2013-11-04 DIAGNOSIS — M4805 Spinal stenosis, thoracolumbar region: Secondary | ICD-10-CM

## 2013-11-04 DIAGNOSIS — I509 Heart failure, unspecified: Secondary | ICD-10-CM

## 2013-11-04 DIAGNOSIS — J189 Pneumonia, unspecified organism: Secondary | ICD-10-CM

## 2013-11-04 NOTE — Progress Notes (Signed)
MRN: [id number] Name: Jamal Collinauline Collings  Sex: female Age: 80 y.o. DOB: 01/22/1917  [facility] Facility[facility]: 218 Level Of Care: SNF Provider: Merrilee SeashoreALEXANDER, ANNE D Emergency Contacts: Extended Emergency Contact Information Primary Emergency Contact: Mandler,Stehpen Address: [address]          [place], [place] [address] [place] Home Phone: [phone] Mobile Phone: [phone] Relation: Grandson Secondary Emergency Contact: Gavin PottersMarino,Marisa Address: [address]          [place], [place] [address] [place] Home Phone: [phone] Mobile Phone: [phone] Relation: Other  Code Status: DNR  Allergies: Review of patient's allergies indicates no known allergies.  Chief Complaint  Patient presents with  . nursing home admission    HPI: Patient is 80 y.o. female who admitted to SNF after acute on chronic CHF 2/2 critical AS.  Past Medical History  Diagnosis Date  . Heart failure   . Spinal stenosis   . Edema   . Glaucoma   . Diabetes mellitus without complication   . GERD (gastroesophageal reflux disease)   . Depression   . Bronchitis   . Endocarditis   . Cardiomyopathy 10/09/2013    Past Surgical History  Procedure Laterality Date  . Appendectomy    . Knee surgery    . Bilateral catract extraction        Medication List       This list is accurate as of: 11/04/13 11:59 PM.  Always use your most recent med list.               acidophilus Caps capsule  Take 1 capsule by mouth daily.     aspirin 81 MG tablet  Take 81 mg by mouth daily.     calcium carbonate 500 MG chewable tablet  Commonly known as:  TUMS - dosed in mg elemental calcium  Chew 1 tablet by mouth every 4 (four) hours as needed for indigestion or heartburn.     fentaNYL 25 MCG/HR patch  Commonly known as:  DURAGESIC  Place 1 patch (25 mcg total) onto the skin every 3 (three) days.     furosemide 20 MG tablet  Commonly known as:  LASIX  Take 1  tablet (20 mg total) by mouth daily. hOLD FOR sbp < 110     hydrOXYzine 10 MG tablet  Commonly known as:  ATARAX/VISTARIL  Take 1 tablet (10 mg total) by mouth every 6 (six) hours as needed for itching or anxiety.     latanoprost 0.005 % ophthalmic solution  Commonly known as:  XALATAN  Place 1 drop into both eyes at bedtime.     LORazepam 1 MG tablet  Commonly known as:  ATIVAN  Take 1 tablet (1 mg total) by mouth every 4 (four) hours as needed for anxiety.     morphine CONCENTRATE 10 mg / 0.5 ml concentrated solution  Take 0.25 mLs (5 mg total) by mouth every 2 (two) hours as needed for moderate pain or shortness of breath.     nitroGLYCERIN 0.4 MG SL tablet  Commonly known as:  NITROSTAT  Place 0.4 mg under the tongue every 5 (five) minutes x 3 doses as needed. For chest pain     nystatin 100000 UNIT/GM Powd  Apply 15 g topically every evening.     omeprazole 20 MG capsule  Commonly known as:  PRILOSEC  Take 20 mg by mouth daily.     ondansetron 4 MG/5ML solution  Commonly known as:  ZOFRAN  Take 5 mLs (4 mg total) by mouth every 8 (eight) hours as needed for nausea or vomiting.     polyethylene glycol packet  Commonly known as:  MIRALAX / GLYCOLAX  Take 17 g by mouth daily as needed for moderate constipation.     psyllium 0.52 G capsule  Commonly known as:  REGULOID  Take 1.04 g by mouth at bedtime.     rOPINIRole 1 MG tablet  Commonly known as:  REQUIP  Take 1 mg by mouth at bedtime.     senna-docusate 8.6-50 MG per tablet  Commonly known as:  Senokot-S  Take 1 tablet by mouth at bedtime.     traZODone 50 MG tablet  Commonly known as:  DESYREL  Take 50 mg by mouth at bedtime.        No orders of the defined types were placed in this encounter.     There is no immunization history on file for this patient.  History  Substance Use Topics  . Smoking status: Former Smoker -- 1.00 packs/day    Types: Cigarettes  . Smokeless tobacco: Never Used  .  Alcohol Use: No    Review of Systems  DATA OBTAINED: from patient GENERAL: Feels well no fevers, fatigue, appetite changes SKIN: No itching, rash HEENT: No complaint RESPIRATORY: No cough, wheezing, SOB CARDIAC: No chest pain, palpitations, lower extremity edema  GI: No abdominal pain, No N/V/D or constipation, No heartburn or reflux  GU: No dysuria, frequency or urgency, or incontinence  MUSCULOSKELETAL: No unrelieved bone/joint pain NEUROLOGIC: No headache, dizziness or focal weakness PSYCHIATRIC: No overt anxiety or sadness. Sleeps well.   Filed Vitals:   11/04/13 1903  BP: 112/59  Pulse: 90  Temp: 98.6 F (37 C)  Resp: 18    Physical Exam  GENERAL APPEARANCE: Alert, conversant. Appropriately groomed. No acute distress  SKIN: No diaphoresis rash HEENT: Unremarkable RESPIRATORY: Breathing is even, unlabored. Lung sounds are clear   CARDIOVASCULAR: Heart RRR 3/6 murmur, no  rubs or gallops. No peripheral edema  GASTROINTESTINAL: Abdomen is soft, non-tender, not distended w/ normal bowel sounds.  GENITOURINARY: Bladder non tender, not distended  MUSCULOSKELETAL: No abnormal joints or musculature NEUROLOGIC: Cranial nerves 2-12 grossly intact. Moves all extremities no tremor. PSYCHIATRIC: Mood and affect appropriate to situation, no behavioral issues  Patient Active Problem List   Diagnosis Date Noted  . PNA (pneumonia) 11/16/2013  . GERD (gastroesophageal reflux disease) 11/16/2013  . Hypotension, unspecified 10/10/2013  . Palliative care encounter 10/10/2013  . Dyspnea 10/10/2013  . Anxiety state, unspecified 10/10/2013  . Severe aortic stenosis 10/09/2013  . Cardiomyopathy 10/09/2013  . Spinal stenosis 10/08/2013  . Diarrhea 10/08/2013  . Acute diastolic CHF (congestive heart failure) 10/07/2013  . CKD (chronic kidney disease), stage III 10/07/2013  . Anemia 10/07/2013  . Metabolic encephalopathy 05/29/2012  . Acute kidney injury 05/29/2012  . Hyperkalemia  05/29/2012  . Metabolic acidosis 05/29/2012  . Protein malnutrition 05/29/2012  . Hypotension 05/29/2012  . Sinus tachycardia 05/29/2012    CBC    Component Value Date/Time   WBC 7.2 10/10/2013 0329   RBC 3.15* 10/10/2013 0329   HGB 9.4* 10/10/2013 0329   HCT 29.1* 10/10/2013 0329   PLT 222 10/10/2013 0329   MCV 92.4 10/10/2013 0329   LYMPHSABS 0.7 10/07/2013 1418   MONOABS 0.5 10/07/2013 1418   EOSABS 0.0 10/07/2013 1418   BASOSABS 0.0 10/07/2013 1418    CMP     Component Value Date/Time  NA 134* 10/10/2013 0329   K 4.4 10/10/2013 0329   CL 94* 10/10/2013 0329   CO2 21 10/10/2013 0329   GLUCOSE 117* 10/10/2013 0329   BUN 64* 10/10/2013 0329   CREATININE 2.48* 10/10/2013 0329   CALCIUM 8.2* 10/10/2013 0329   PROT 7.3 10/07/2013 1418   ALBUMIN 4.0 10/07/2013 1418   AST 12 10/07/2013 1418   ALT 7 10/07/2013 1418   ALKPHOS 62 10/07/2013 1418   BILITOT 0.2* 10/07/2013 1418   GFRNONAA 16* 10/10/2013 0329   GFRAA 18* 10/10/2013 0329    Assessment and Plan  Acute diastolic CHF (congestive heart failure) #1 acute systolic and diastolic CHF exacerbation  Patient with clinical improvement. Likely secondary to severe aortic stenosis noted on 2-D echo. Cardiac enzymes which was cycled had one enzyme which was slightly elevated at 0.31 otherwise rest with negative x2. Patient denies any chest pain. EKG with no significant change since prior EKG. I/O. = -2.330 L. Patient with borderline blood pressure. Patient also with chronic kidney disease and rising creatinine. IV Lasix has been changed to oral Lasix per cardiology. WPro BNP on admission was 78242. Will decrease IV Lasix to 20 mg IV every 12 hours. Continue aspirin. 2-D echo with EF of 25-30% and severe aortic stenosis with valve area of 0.51 cm. Pulmonary artery systolic pressure was severely increased with PA pressure of 71 mmHg. patient noted to be hypotensive with increasing creatinine. Patient's Lasix were held. Unable to place on a beta blocker  secondary to borderline blood pressure. Patient not an ACE inhibitor candidate secondary to borderline blood pressure in chronic kidney disease. Once renal function is improved her blood pressure improved will place on Lasix 20 mg daily as per cardiology recommendations, when blood pressure improve and renal function has improved. Patient is being discharged on Lasix 20 mg daily to hold if systolic blood pressure less than 110   Severe aortic stenosis Per 2-D echo patient also with a severely depressed left ventricular function and critical aortic stenosis via phone 25-30%. Valve area of 0.51 cm. Patient has been seen by cardiology and also discussion about possible balloon valvuloplasty. Due to patient's age is not a candidate for aortic valve replacement. Renal function has deteriorated with some diuresis as well. Patient at this point in time is not inclined to have any further invasive procedures. This was discussed with the grandson who is also in agreement. Continue medical management per cardiology for some symptomatic relief. Palliative care was consulted for goals of care. Patient was seen by palliative care. Patient was made DO NOT RESUSCITATE and main focus of care with comfort. This was placed on oxygen as needed for comfort. No artificial feeding now or in the future. It was felt that the family and patient and make a decision as to whether to use antibiotics to treat infection. Patient does not want any blood products. IV fluids at [facility]. Patient was placed on Ativan for agitation and anxiety as well as Roxanol for pain/dyspnea. Patient will be discharged to a skilled nursing facility with palliative care following. Was also recommended that patient be transitioned to hospice services as quickly as possible was she transitions to the SNF   PNA (pneumonia) Patient was on antibiotics prior to admission. Patient was started back on Levaquin and treated for 5 days to complete a course.     Diarrhea Patient was noted on admission to have diarrhea. Patient did not have any further loose stools. C. difficile PCR was negative. Follow  CKD (chronic kidney disease), stage III Stable. Creatinine slowly rising. Patient noted to be hypotensive 2 days ago. Patient's diuretics were discontinued. Patient was also bolused with IV fluids. This needs to be followed up upon as outpatient.   Spinal stenosis Continue same pain regimen  Palliative care encounter Patient is presenting with symptomatic severe critical aortic stenosis with cardiomyopathy and severely depressed left ventricular function. Patient with borderline blood pressure and a such a be placed on a beta blocker or ACE inhibitor secondary to chronic kidney disease. Patient due to her age is not a candidate for aortic valve replacement. Patient at this time does not want to proceed with any invasive procedures. Will continue diuresis and medical management as per cardiology for some symptomatic relief. Patient has been seen by palliative care      GERD (gastroesophageal reflux disease) Presenting as epigastric fullness and gas -omeprazole 20 mg  Anxiety state, unspecified And insomnia - will inc trazodone to 100 mg po qHS    Margit Hanks, MD

## 2013-11-16 ENCOUNTER — Encounter: Payer: Self-pay | Admitting: Internal Medicine

## 2013-11-16 DIAGNOSIS — K219 Gastro-esophageal reflux disease without esophagitis: Secondary | ICD-10-CM | POA: Insufficient documentation

## 2013-11-16 DIAGNOSIS — J189 Pneumonia, unspecified organism: Secondary | ICD-10-CM | POA: Insufficient documentation

## 2013-11-16 NOTE — Assessment & Plan Note (Signed)
Presenting as epigastric fullness and gas -omeprazole 20 mg

## 2013-11-16 NOTE — Assessment & Plan Note (Signed)
#  1 acute systolic and diastolic CHF exacerbation  Patient with clinical improvement. Likely secondary to severe aortic stenosis noted on 2-D echo. Cardiac enzymes which was cycled had one enzyme which was slightly elevated at 0.31 otherwise rest with negative x2. Patient denies any chest pain. EKG with no significant change since prior EKG. I/O. = -2.330 L. Patient with borderline blood pressure. Patient also with chronic kidney disease and rising creatinine. IV Lasix has been changed to oral Lasix per cardiology. WPro BNP on admission was 25053. Will decrease IV Lasix to 20 mg IV every 12 hours. Continue aspirin. 2-D echo with EF of 25-30% and severe aortic stenosis with valve area of 0.51 cm. Pulmonary artery systolic pressure was severely increased with PA pressure of 71 mmHg. patient noted to be hypotensive with increasing creatinine. Patient's Lasix were held. Unable to place on a beta blocker secondary to borderline blood pressure. Patient not an ACE inhibitor candidate secondary to borderline blood pressure in chronic kidney disease. Once renal function is improved her blood pressure improved will place on Lasix 20 mg daily as per cardiology recommendations, when blood pressure improve and renal function has improved. Patient is being discharged on Lasix 20 mg daily to hold if systolic blood pressure less than 110

## 2013-11-16 NOTE — Assessment & Plan Note (Signed)
And insomnia - will inc trazodone to 100 mg po qHS

## 2013-11-16 NOTE — Assessment & Plan Note (Signed)
Patient is presenting with symptomatic severe critical aortic stenosis with cardiomyopathy and severely depressed left ventricular function. Patient with borderline blood pressure and a such a be placed on a beta blocker or ACE inhibitor secondary to chronic kidney disease. Patient due to her age is not a candidate for aortic valve replacement. Patient at this time does not want to proceed with any invasive procedures. Will continue diuresis and medical management as per cardiology for some symptomatic relief. Patient has been seen by palliative care

## 2013-11-16 NOTE — Assessment & Plan Note (Signed)
Patient was noted on admission to have diarrhea. Patient did not have any further loose stools. C. difficile PCR was negative. Follow

## 2013-11-16 NOTE — Assessment & Plan Note (Signed)
Per 2-D echo patient also with a severely depressed left ventricular function and critical aortic stenosis via phone 25-30%. Valve area of 0.51 cm. Patient has been seen by cardiology and also discussion about possible balloon valvuloplasty. Due to patient's age is not a candidate for aortic valve replacement. Renal function has deteriorated with some diuresis as well. Patient at this point in time is not inclined to have any further invasive procedures. This was discussed with the grandson who is also in agreement. Continue medical management per cardiology for some symptomatic relief. Palliative care was consulted for goals of care. Patient was seen by palliative care. Patient was made DO NOT RESUSCITATE and main focus of care with comfort. This was placed on oxygen as needed for comfort. No artificial feeding now or in the future. It was felt that the family and patient and make a decision as to whether to use antibiotics to treat infection. Patient does not want any blood products. IV fluids at [facility]. Patient was placed on Ativan for agitation and anxiety as well as Roxanol for pain/dyspnea. Patient will be discharged to a skilled nursing facility with palliative care following. Was also recommended that patient be transitioned to hospice services as quickly as possible was she transitions to the SNF

## 2013-11-16 NOTE — Assessment & Plan Note (Signed)
Continue same pain regimen

## 2013-11-16 NOTE — Assessment & Plan Note (Signed)
Stable. Creatinine slowly rising. Patient noted to be hypotensive 2 days ago. Patient's diuretics were discontinued. Patient was also bolused with IV fluids. This needs to be followed up upon as outpatient.

## 2013-11-16 NOTE — Assessment & Plan Note (Signed)
Patient was on antibiotics prior to admission. Patient was started back on Levaquin and treated for 5 days to complete a course.

## 2013-12-16 ENCOUNTER — Other Ambulatory Visit: Payer: Self-pay | Admitting: *Deleted

## 2013-12-16 MED ORDER — FENTANYL 25 MCG/HR TD PT72: 25.0000 ug | MEDICATED_PATCH | TRANSDERMAL | Status: AC

## 2013-12-16 MED ORDER — MORPHINE SULFATE (CONCENTRATE) 20 MG/ML PO SOLN: ORAL | Status: DC

## 2013-12-16 NOTE — Telephone Encounter (Signed)
[facility] 

## 2013-12-24 ENCOUNTER — Non-Acute Institutional Stay (SKILLED_NURSING_FACILITY): Payer: Medicare Other | Admitting: Internal Medicine

## 2013-12-24 ENCOUNTER — Encounter: Payer: Self-pay | Admitting: Internal Medicine

## 2013-12-24 DIAGNOSIS — I5032 Chronic diastolic (congestive) heart failure: Secondary | ICD-10-CM

## 2013-12-24 DIAGNOSIS — N183 Chronic kidney disease, stage 3 unspecified: Secondary | ICD-10-CM

## 2013-12-24 DIAGNOSIS — K5909 Other constipation: Secondary | ICD-10-CM

## 2013-12-24 DIAGNOSIS — M48061 Spinal stenosis, lumbar region without neurogenic claudication: Secondary | ICD-10-CM

## 2013-12-24 DIAGNOSIS — I509 Heart failure, unspecified: Secondary | ICD-10-CM

## 2013-12-24 DIAGNOSIS — H409 Unspecified glaucoma: Secondary | ICD-10-CM

## 2013-12-24 DIAGNOSIS — I350 Nonrheumatic aortic (valve) stenosis: Secondary | ICD-10-CM

## 2013-12-24 DIAGNOSIS — I359 Nonrheumatic aortic valve disorder, unspecified: Secondary | ICD-10-CM

## 2013-12-24 DIAGNOSIS — K5900 Constipation, unspecified: Secondary | ICD-10-CM

## 2013-12-24 NOTE — Progress Notes (Signed)
Patient ID: Candice Barker, female   DOB: 28-May-1916, 80 y.o.   MRN: [id number]  Location:  [facility] Provider:  [name]. Renato Gails, D.O., C.M.D.  Code Status:  DNR   Chief Complaint  Patient presents with  . Medical Management of Chronic Issues    HPI:  Candice Barker female here for long term care and hospice.  She is c/o her constipation today.    She does not want sleeping pills to help her rest due to prior side effects.  Review of Systems:  Review of Systems  Constitutional: Positive for malaise/fatigue. Negative for fever.  HENT: Negative for congestion and hearing loss.   Eyes: Positive for blurred vision.  Respiratory: Negative for shortness of breath.   Cardiovascular: Negative for chest pain.  Gastrointestinal: Positive for constipation. Negative for diarrhea, blood in stool and melena.  Genitourinary: Negative for dysuria.  Musculoskeletal: Negative for falls.  Skin: Negative for rash.  Neurological: Positive for dizziness and weakness. Negative for headaches.  Endo/Heme/Allergies: Bruises/bleeds easily.  Psychiatric/Behavioral: Positive for depression and memory loss. The patient has insomnia.     Medications: Patient's Medications  New Prescriptions   No medications on file  Previous Medications   ALUMINUM-MAGNESIUM HYDROXIDE-SIMETHICONE (MAALOX) 200-200-20 MG/5ML SUSP    Take 30 mLs by mouth every morning.   ASPIRIN 81 MG TABLET    Take 81 mg by mouth daily.   CALCIUM CARBONATE (TUMS - DOSED IN MG ELEMENTAL CALCIUM) 500 MG CHEWABLE TABLET    Chew 1 tablet by mouth every 4 (four) hours as needed for indigestion or heartburn.   DEXTROMETHORPHAN 15 MG/5ML SYRUP    Take 10 mLs by mouth 4 (four) times daily as needed for cough.   FENTANYL (DURAGESIC) 25 MCG/HR PATCH    Place 1 patch (25 mcg total) onto the skin every 3 (three) days.   FUROSEMIDE (LASIX) 20 MG TABLET    Take 1 tablet (20 mg total) by mouth daily. hOLD FOR sbp < 110   HYDROXYZINE  (ATARAX/VISTARIL) 10 MG TABLET    Take 1 tablet (10 mg total) by mouth every 6 (six) hours as needed for itching or anxiety.   NITROGLYCERIN (NITROSTAT) 0.4 MG SL TABLET    Place 0.4 mg under the tongue every 5 (five) minutes x 3 doses as needed. For chest pain   OMEPRAZOLE (PRILOSEC) 20 MG CAPSULE    Take 20 mg by mouth daily.   OXYCODONE (ROXICODONE INTENSOL) 20 MG/ML CONCENTRATED SOLUTION    Take 5 mg by mouth every 4 (four) hours as needed for severe pain.   PRAMOXINE (SARNA SENSITIVE) 1 % LOTN    Apply 1 application topically 2 (two) times daily.   PROMETHAZINE (PHENERGAN) 12.5 MG TABLET    Take 12.5 mg by mouth every 4 (four) hours as needed for nausea or vomiting.   ROPINIROLE (REQUIP) 1 MG TABLET    Take 1 mg by mouth at bedtime.   SENNA-DOCUSATE (SENOKOT-S) 8.6-50 MG PER TABLET    Take 1 tablet by mouth at bedtime.   TRAZODONE (DESYREL) 50 MG TABLET    Take 50 mg by mouth at bedtime.  Modified Medications   Modified Medication Previous Medication   LORAZEPAM (ATIVAN) 1 MG TABLET LORazepam (ATIVAN) 1 MG tablet      1/2 tab by mouth bid and 1 tab by mouth prn anxiety    Take 1 tablet (1 mg total) by mouth every 4 (four) hours as needed for anxiety.  Discontinued Medications  ACIDOPHILUS (RISAQUAD) CAPS CAPSULE    Take 1 capsule by mouth daily.   LATANOPROST (XALATAN) 0.005 % OPHTHALMIC SOLUTION    Place 1 drop into both eyes at bedtime.   MORPHINE (ROXANOL) 20 MG/ML CONCENTRATED SOLUTION    Give 0.55ml by mouth every 2 hours as needed for pain/SOB   MORPHINE SULFATE (MORPHINE CONCENTRATE) 10 MG / 0.5 ML CONCENTRATED SOLUTION    Take 0.25 mLs (5 mg total) by mouth every 2 (two) hours as needed for moderate pain or shortness of breath.   NYSTATIN (MYCOSTATIN/NYSTOP) 100000 UNIT/GM POWD    Apply 15 g topically every evening.   ONDANSETRON (ZOFRAN) 4 MG/5ML SOLUTION    Take 5 mLs (4 mg total) by mouth every 8 (eight) hours as needed for nausea or vomiting.   POLYETHYLENE GLYCOL (MIRALAX /  GLYCOLAX) PACKET    Take 17 g by mouth daily as needed for moderate constipation.   PSYLLIUM (REGULOID) 0.52 G CAPSULE    Take 1.04 g by mouth at bedtime.    Physical Exam: Filed Vitals:   12/24/13 1520  BP: 123/89  Pulse: 90  Temp: 97.3 F (36.3 C)  Resp: 20  Height: 5' (1.524 m)  Weight: 134 lb (60.782 kg)  SpO2: 97%  Physical Exam  Constitutional: She is oriented to person, place, and time. No distress.  White female seated in wheelchair  Cardiovascular:  Murmur heard. 2/6 systolic murmur  Pulmonary/Chest: Effort normal and breath sounds normal. No respiratory distress.  Abdominal: Soft. Bowel sounds are normal. She exhibits no distension. There is no tenderness.  Musculoskeletal: Normal range of motion.  Neurological: She is alert and oriented to person, place, and time.  Skin: Skin is warm and dry. There is pallor.  Psychiatric:  Sad affect today    Labs reviewed: Basic Metabolic Panel:  Recent Labs  14/78/29 1208 10/09/13 0404 10/10/13 0329  NA 139 137 134*  K 4.7 4.6 4.4  CL 97 94* 94*  CO2 GLUCOSE 85 112* 117*  BUN 45* 50* 64*  CREATININE 2.16* 2.35* 2.48*  CALCIUM 9.0 8.9 8.2*  MG 1.7  --   --     Liver Function Tests:  Recent Labs  10/07/13 1418  AST 12  ALT 7  ALKPHOS 62  BILITOT 0.2*  PROT 7.3  ALBUMIN 4.0    CBC:  Recent Labs  10/07/13 1418 10/08/13 1208 10/10/13 0329  WBC 7.2 7.7 7.2  NEUTROABS 5.9  --   --   HGB 8.6* 9.8* 9.4*  HCT 26.6* 31.0* 29.1*  MCV 93.0 92.8 92.4  PLT 207 249 222    Assessment/Plan 1. Severe aortic stenosis -stable, no recent chest pain or near syncope, does have some chronic dyspnea which is multifactorial and uses oxygen  2. CKD (chronic kidney disease), stage III -cont current mgt with goal being comfort, hospice  3. Chronic diastolic CHF (congestive heart failure) -has been stable lately -chronic peripheral edema -to use teds and elevate feet at rest  4. Spinal stenosis of  lumbar region -chronic pain related to this; cont oxycodone concentrate and morphine concentrate as needed for her pain and fentanyl patch  5. Chronic constipation -due to narcotics use -senna s and miralax to help with this, also takes prunes at times which are helpful  6. Glaucoma -cont latanoprost  Family/ staff Communication: discussed with nursing staff and pt's granddaughter who was present  Goals of care: hospice care--comfort measures   Labs/tests ordered:  none

## 2013-12-30 ENCOUNTER — Other Ambulatory Visit: Payer: Self-pay | Admitting: *Deleted

## 2013-12-30 MED ORDER — LORAZEPAM 0.5 MG PO TABS: ORAL_TABLET | ORAL | Status: DC

## 2014-02-10 ENCOUNTER — Other Ambulatory Visit: Payer: Self-pay | Admitting: *Deleted

## 2014-02-10 MED ORDER — AMBULATORY NON FORMULARY MEDICATION: Status: DC

## 2014-02-10 NOTE — Telephone Encounter (Signed)
[facility] 

## 2014-03-20 ENCOUNTER — Encounter (HOSPITAL_COMMUNITY): Payer: Self-pay | Admitting: Emergency Medicine

## 2014-03-20 ENCOUNTER — Emergency Department (HOSPITAL_COMMUNITY)
Admission: EM | Admit: 2014-03-20 | Discharge: 2014-03-20 | Disposition: A | Discharge status: Home / Self Care | Attending: Emergency Medicine | Admitting: Emergency Medicine

## 2014-03-20 DIAGNOSIS — I509 Heart failure, unspecified: Secondary | ICD-10-CM | POA: Insufficient documentation

## 2014-03-20 DIAGNOSIS — Z7982 Long term (current) use of aspirin: Secondary | ICD-10-CM | POA: Diagnosis not present

## 2014-03-20 DIAGNOSIS — Y998 Other external cause status: Secondary | ICD-10-CM | POA: Diagnosis not present

## 2014-03-20 DIAGNOSIS — Z8709 Personal history of other diseases of the respiratory system: Secondary | ICD-10-CM | POA: Diagnosis not present

## 2014-03-20 DIAGNOSIS — Z79899 Other long term (current) drug therapy: Secondary | ICD-10-CM | POA: Insufficient documentation

## 2014-03-20 DIAGNOSIS — E119 Type 2 diabetes mellitus without complications: Secondary | ICD-10-CM | POA: Insufficient documentation

## 2014-03-20 DIAGNOSIS — Z8739 Personal history of other diseases of the musculoskeletal system and connective tissue: Secondary | ICD-10-CM | POA: Insufficient documentation

## 2014-03-20 DIAGNOSIS — Y92003 Bedroom of unspecified non-institutional (private) residence as the place of occurrence of the external cause: Secondary | ICD-10-CM | POA: Diagnosis not present

## 2014-03-20 DIAGNOSIS — Y9389 Activity, other specified: Secondary | ICD-10-CM | POA: Diagnosis not present

## 2014-03-20 DIAGNOSIS — S4992XA Unspecified injury of left shoulder and upper arm, initial encounter: Secondary | ICD-10-CM | POA: Diagnosis present

## 2014-03-20 DIAGNOSIS — Z87891 Personal history of nicotine dependence: Secondary | ICD-10-CM | POA: Insufficient documentation

## 2014-03-20 DIAGNOSIS — W06XXXA Fall from bed, initial encounter: Secondary | ICD-10-CM | POA: Insufficient documentation

## 2014-03-20 DIAGNOSIS — K219 Gastro-esophageal reflux disease without esophagitis: Secondary | ICD-10-CM | POA: Insufficient documentation

## 2014-03-20 DIAGNOSIS — S40022A Contusion of left upper arm, initial encounter: Secondary | ICD-10-CM | POA: Diagnosis not present

## 2014-03-20 DIAGNOSIS — H409 Unspecified glaucoma: Secondary | ICD-10-CM | POA: Diagnosis not present

## 2014-03-20 DIAGNOSIS — R011 Cardiac murmur, unspecified: Secondary | ICD-10-CM | POA: Diagnosis not present

## 2014-03-20 DIAGNOSIS — W19XXXA Unspecified fall, initial encounter: Secondary | ICD-10-CM

## 2014-03-20 DIAGNOSIS — F329 Major depressive disorder, single episode, unspecified: Secondary | ICD-10-CM | POA: Diagnosis not present

## 2014-03-20 NOTE — ED Notes (Signed)
Per EMS-pt from [facility] c/o unwitnessed fall today. Denies pain. AAOx4. No LOC.

## 2014-03-20 NOTE — ED Provider Notes (Signed)
CSN: [id number]     Arrival date & time 03/20/14  1645 History   First MD Initiated Contact with Patient 03/20/14 1810     Chief Complaint  Patient presents with  . Fall     (Consider location/radiation/quality/duration/timing/severity/associated sxs/prior Treatment) HPI Patient is a 80 year old female with past medical history of CHF, glaucoma, diabetes, dementia, depression who presents the ER after a fall at [facility]. Patient's daughter is present with patient, and states that patient is acting at her baseline for mental status. Patient reports she was attempting to get out of bed, and "slid out of bed". Patient states she fell on her "bottom" on the floor. Patient states she is able to recall the incident. Patient's daughter reports that patient does not get out of bed without assistance, and it was reported that patient was found on the floor by staff and contacted her. Patient denies having any pain, denies having any symptoms. Patient denies headache, blurred vision, dizziness, weakness, chest pain, shortness of breath, abdominal pain, arthralgias,  Past Medical History  Diagnosis Date  . Heart failure   . Spinal stenosis   . Edema   . Glaucoma   . Diabetes mellitus without complication   . GERD (gastroesophageal reflux disease)   . Depression   . Bronchitis   . Endocarditis   . Cardiomyopathy 10/09/2013   Past Surgical History  Procedure Laterality Date  . Appendectomy    . Knee surgery    . Bilateral catract extraction     Family History  Problem Relation Age of Onset  . Other Mother   . Other Father    History  Substance Use Topics  . Smoking status: Former Smoker -- 1.00 packs/day    Types: Cigarettes  . Smokeless tobacco: Never Used  . Alcohol Use: No   OB History    No data available     Review of Systems  Constitutional: Negative for fever.  HENT: Negative for trouble swallowing.   Eyes: Negative for visual disturbance.  Respiratory:  Negative for shortness of breath.   Cardiovascular: Negative for chest pain.  Gastrointestinal: Negative for nausea, vomiting and abdominal pain.  Genitourinary: Negative for dysuria.  Musculoskeletal: Negative for neck pain.  Skin: Negative for rash.  Neurological: Negative for dizziness, weakness and numbness.  Psychiatric/Behavioral: Negative.       Allergies  Review of patient's allergies indicates no known allergies.  Home Medications   Prior to Admission medications   Medication Sig Start Date End Date Taking? Authorizing Provider  aluminum-magnesium hydroxide-simethicone (MAALOX) 200-200-20 MG/5ML SUSP Take 30 mLs by mouth every morning.   Yes Historical Provider, MD  aspirin 81 MG tablet Take 81 mg by mouth daily.   Yes Historical Provider, MD  dextromethorphan 15 MG/5ML syrup Take 10 mLs by mouth 4 (four) times daily as needed for cough.   Yes Historical Provider, MD  fentaNYL (DURAGESIC) 25 MCG/HR patch Place 1 patch (25 mcg total) onto the skin every 3 (three) days. 12/16/13  Yes Mahima Glade Lloyd, MD  furosemide (LASIX) 20 MG tablet Take 1 tablet (20 mg total) by mouth daily. hOLD FOR sbp < 110 10/11/13  Yes Rodolph Bong, MD  latanoprost (XALATAN) 0.005 % ophthalmic solution Place 1 drop into both eyes at bedtime.   Yes Historical Provider, MD  LORazepam (ATIVAN) 1 MG tablet 1/2 tab by mouth bid and 1 tab by mouth prn anxiety 10/11/13  Yes Rodolph Bong, MD  mirtazapine (REMERON) 7.5 MG tablet Take 7.5  mg by mouth at bedtime. For decrease weight   Yes Historical Provider, MD  Multiple Vitamin (MULTIVITAMIN WITH MINERALS) TABS tablet Take 1 tablet by mouth daily.   Yes Historical Provider, MD  nitroGLYCERIN (NITROSTAT) 0.4 MG SL tablet Place 0.4 mg under the tongue every 5 (five) minutes x 3 doses as needed. For chest pain   Yes Historical Provider, MD  omeprazole (PRILOSEC) 20 MG capsule Take 20 mg by mouth daily.   Yes Historical Provider, MD  oxyCODONE (ROXICODONE  INTENSOL) 20 MG/ML concentrated solution Take 5 mg by mouth every 4 (four) hours as needed for severe pain.   Yes Historical Provider, MD  pramoxine (SARNA SENSITIVE) 1 % LOTN Apply 1 application topically 2 (two) times daily.   Yes Historical Provider, MD  promethazine (PHENERGAN) 12.5 MG tablet Take 12.5 mg by mouth every 4 (four) hours as needed for nausea or vomiting.   Yes Historical Provider, MD  rOPINIRole (REQUIP) 1 MG tablet Take 1 mg by mouth at bedtime. For restless leg syndrome   Yes Historical Provider, MD  senna-docusate (SENOKOT-S) 8.6-50 MG per tablet Take 1 tablet by mouth at bedtime.   Yes Historical Provider, MD  traZODone (DESYREL) 50 MG tablet Take 50 mg by mouth at bedtime.   Yes Historical Provider, MD  AMBULATORY NON FORMULARY MEDICATION Morphine Sul Sol 100/675ml Sig: Give 0.1325ml by mouth every 2 hours as needed for pain or SOB 02/10/14   Oneal GroutMahima Pandey, MD  calcium carbonate (TUMS - DOSED IN MG ELEMENTAL CALCIUM) 500 MG chewable tablet Chew 1 tablet by mouth every 4 (four) hours as needed for indigestion or heartburn.    Historical Provider, MD  hydrOXYzine (ATARAX/VISTARIL) 10 MG tablet Take 1 tablet (10 mg total) by mouth every 6 (six) hours as needed for itching or anxiety. 10/11/13   Rodolph Bonganiel Thompson V, MD  LORazepam (ATIVAN) 0.5 MG tablet Take one tablet by mouth twice daily for anxiety 12/30/13   Oneal GroutMahima Pandey, MD   BP 96/69 mmHg  Pulse 84  Temp(Src) 97.8 F (36.6 C) (Oral)  Resp 18  SpO2 96% Physical Exam  Constitutional: She appears well-developed and well-nourished. No distress.  Elderly female resting comfortably, sitting upright in the stretcher in no acute distress.  HENT:  Head: Normocephalic and atraumatic.  Mouth/Throat: Oropharynx is clear and moist. No oropharyngeal exudate.  Eyes: Conjunctivae and EOM are normal. Pupils are equal, round, and reactive to light. Right eye exhibits no discharge. Left eye exhibits no discharge. No scleral icterus.  Neck:  Normal range of motion and full passive range of motion without pain. Neck supple. No spinous process tenderness and no muscular tenderness present.  Cardiovascular: Normal rate and normal pulses.  An irregular rhythm present.  Murmur heard.  Systolic murmur is present with a grade of 3/6  Murmur best heard throughout the precordium  Pulmonary/Chest: Effort normal and breath sounds normal. No accessory muscle usage. No tachypnea. No respiratory distress.  Abdominal: Soft. Normal appearance and bowel sounds are normal. There is no tenderness.  Musculoskeletal: Normal range of motion. She exhibits no edema or tenderness.       Arms: Neurological: She is alert. She has normal strength. No cranial nerve deficit or sensory deficit. Coordination normal.  Patient fully alert answering questions appropriately to person and event in full, clear sentences. Cranial nerves II through XII grossly intact. Motor strength 5 out of 5 in all major muscle groups of upper and lower extremities. Distal sensation intact.  Skin: Skin is warm  and dry. No rash noted. She is not diaphoretic.  Psychiatric: She has a normal mood and affect.    ED Course  Procedures (including critical care time) Labs Review Labs Reviewed - No data to display  Imaging Review No results found.   EKG Interpretation None      MDM   Final diagnoses:  Fall, initial encounter    Patient here status post fall. Patient denying any complaints. Patient stating she "slid out of bed while attempting to get up". Patient well appearing, sitting upright in the stretcher, making eye contact, answering questions appropriately to her normal mentation as reported by daughter, and in no acute distress. Patient non-tachycardic, non-hypoxic, nontachypneic and afebrile. Daughter present with her stating she prefers patient do not have an extensive workup today. With patient denying any complaints, with normal neuro exam, no obvious signs of trauma or  injury.  I spoke to patient's daughter at length about a possible workup, and patient's daughter reported that she did not wish to have patient transported to the ER today however this was the policy of the living center with an unwitnessed fall. I discussed the possibilities of some workup with blood work, EKG to rule out cardiac abnormalities, and up to a more extensive workup including a head CT and neck CT to rule out intracranial abnormalities. Patient's daughter stated that she understood the risks of patient not having a workup today. She stated that if there were any intracranial abnormalities, they would not pursue corrective measures.  Patient's daughter stated that patient is a DO NOT RESUSCITATE, and that she feels comfortable that patient is not injured and acting totally appropriately for herself. Patient's daughter states that patient will typically let her know if she is in pain, which patient's daughter and I asked patient multiple times, and patient continues to deny any pain. With patient's age, comorbidities, and benign presentation today I feel the patient's daughter is making a reasonable choice, and do not feel that based on patient's and patient's family's preference is that a workup will be in any way beneficial to patient at this time. I discussed return precautions with patient and her daughter, and they were agreeable to this plan. Patient discharged back to the nursing facility. I encouraged patient and her daughter to call or return to the ER should patient develop any symptoms, or they have any questions or concerns.   BP 96/69 mmHg  Pulse 84  Temp(Src) 97.8 F (36.6 C) (Oral)  Resp 18  SpO2 96%  Signed,  Ladona Mow, PA-C 3:04 AM  Patient seen and discussed with Dr. Tilden Fossa, M.D.  Monte Fantasia, PA-C 03/21/14 [id number]  Tilden Fossa, MD 03/21/14 [phone]

## 2014-03-20 NOTE — Discharge Instructions (Signed)

## 2014-03-20 NOTE — ED Notes (Signed)
Bed: [facility] Expected date:  Expected time:  Means of arrival:  Comments: 80 yo F Fall no complaint

## 2014-03-20 NOTE — Progress Notes (Addendum)
CSW met with Pt at bedside. However, the Pt was not able to hear very well. Per granddaughter, the Pt did not have in her hearing aids. The granddaughter stated the Pt  Had and unwitnessed fall at [facility]. Family plans that the Pt will return to [facility] upon discharge.   Granddaughter/Pam Grampian [phone]  Willette Brace [phone] ED CSW 03/20/2014 6:14 PM

## 2014-03-20 NOTE — ED Notes (Signed)
[facility] called  

## 2014-03-23 ENCOUNTER — Non-Acute Institutional Stay (SKILLED_NURSING_FACILITY): Payer: Medicare Other | Admitting: Adult Health

## 2014-03-23 ENCOUNTER — Encounter: Payer: Self-pay | Admitting: Adult Health

## 2014-03-23 DIAGNOSIS — K219 Gastro-esophageal reflux disease without esophagitis: Secondary | ICD-10-CM

## 2014-03-23 DIAGNOSIS — E46 Unspecified protein-calorie malnutrition: Secondary | ICD-10-CM

## 2014-03-23 DIAGNOSIS — M4805 Spinal stenosis, thoracolumbar region: Secondary | ICD-10-CM

## 2014-03-23 DIAGNOSIS — E40 Kwashiorkor: Secondary | ICD-10-CM

## 2014-03-23 DIAGNOSIS — I5031 Acute diastolic (congestive) heart failure: Secondary | ICD-10-CM

## 2014-03-23 DIAGNOSIS — F411 Generalized anxiety disorder: Secondary | ICD-10-CM

## 2014-03-23 NOTE — Progress Notes (Signed)
Patient ID: Candice Barker, female   DOB: 12/01/1916, 80 y.o.   MRN: [id number]  [facility]     No Known Allergies     Chief Complaint  Patient presents with  . Medical Management of Chronic Issues    HPI:  She is a long term resident of this facility being seen for the management of her chronic illnesses. She is followed by hospice care. She states that she feels good today. There are no nursing concerns being voiced today.  She has had a recent fall without any injury and no complications.    Past Medical History  Diagnosis Date  . Heart failure   . Spinal stenosis   . Edema   . Glaucoma   . Diabetes mellitus without complication   . GERD (gastroesophageal reflux disease)   . Depression   . Bronchitis   . Endocarditis   . Cardiomyopathy 10/09/2013    Past Surgical History  Procedure Laterality Date  . Appendectomy    . Knee surgery    . Bilateral catract extraction      VITAL SIGNS BP 110/62 mmHg  Pulse 79  Ht 5' (1.524 m)  Wt 127 lb (57.607 kg)  BMI 24.80 kg/m2  SpO2 96%   Outpatient Encounter Prescriptions as of 03/23/2014  Medication Sig  . aluminum-magnesium hydroxide-simethicone (MAALOX) 200-200-20 MG/5ML SUSP Take 30 mLs by mouth every morning.  Marland Kitchen. aspirin 81 MG tablet Take 81 mg by mouth daily.  . calcium carbonate (TUMS - DOSED IN MG ELEMENTAL CALCIUM) 500 MG chewable tablet Chew 1 tablet by mouth every 4 (four) hours as needed for indigestion or heartburn.  . dextromethorphan 15 MG/5ML syrup Take 10 mLs by mouth 4 (four) times daily as needed for cough.  . fentaNYL (DURAGESIC) 25 MCG/HR patch Place 1 patch (25 mcg total) onto the skin every 3 (three) days.  . furosemide (LASIX) 20 MG tablet Take 1 tablet (20 mg total) by mouth daily. hOLD FOR sbp < 110  . hydrOXYzine (ATARAX/VISTARIL) 10 MG tablet Take 1 tablet (10 mg total) by mouth every 6 (six) hours as needed for itching or anxiety.  Marland Kitchen. latanoprost (XALATAN) 0.005 % ophthalmic solution Place 1 drop  into both eyes at bedtime.  Marland Kitchen. LORazepam (ATIVAN) 0.5 MG tablet Take one tablet by mouth twice daily for anxiety  . LORazepam (ATIVAN) 1 MG tablet Take 1 mg by mouth every 4 (four) hours as needed.   . mirtazapine (REMERON) 7.5 MG tablet Take 7.5 mg by mouth at bedtime. For decrease weight  . Multiple Vitamin (MULTIVITAMIN WITH MINERALS) TABS tablet Take 1 tablet by mouth daily.  . nitroGLYCERIN (NITROSTAT) 0.4 MG SL tablet Place 0.4 mg under the tongue every 5 (five) minutes x 3 doses as needed. For chest pain  . omeprazole (PRILOSEC) 20 MG capsule Take 20 mg by mouth daily.  Marland Kitchen. oxyCODONE (ROXICODONE INTENSOL) 20 MG/ML concentrated solution Take 5 mg by mouth every 4 (four) hours as needed for severe pain.  . pramoxine (SARNA SENSITIVE) 1 % LOTN Apply 1 application topically 2 (two) times daily.  . promethazine (PHENERGAN) 12.5 MG tablet Take 12.5 mg by mouth every 4 (four) hours as needed for nausea or vomiting.  Marland Kitchen. rOPINIRole (REQUIP) 1 MG tablet Take 1 mg by mouth at bedtime. For restless leg syndrome  . senna-docusate (SENOKOT-S) 8.6-50 MG per tablet Take 2 tablets by mouth at bedtime.   . traZODone (DESYREL) 50 MG tablet Take 100 mg by mouth at bedtime.   . [  DISCONTINUED] AMBULATORY NON FORMULARY MEDICATION Morphine Sul Sol 100/28ml Sig: Give 0.51ml by mouth every 2 hours as needed for pain or SOB (Patient not taking: Reported on 03/23/2014)     SIGNIFICANT DIAGNOSTIC EXAMS  LABS REVIEWED:   10-17-13: glucose 107; bun 43.3; creat 1.86; k+4.8; na++143    Review of Systems  Constitutional: Negative for malaise/fatigue.  Respiratory: Negative for cough and shortness of breath.   Cardiovascular: Negative for chest pain.  Musculoskeletal: Negative for myalgias.  Skin: Negative.   Psychiatric/Behavioral: The patient is not nervous/anxious.      Physical Exam  Constitutional: No distress.  Frail   Neck: Neck supple. No JVD present.  Cardiovascular: Normal rate, regular rhythm and  intact distal pulses.   Respiratory: Effort normal and breath sounds normal. No respiratory distress.  GI: Soft. Bowel sounds are normal. She exhibits no distension. There is no tenderness.  Musculoskeletal: She exhibits no edema.  Is able to move all extremities   Neurological: She is alert.  Skin: Skin is warm and dry. She is not diaphoretic.       ASSESSMENT/ PLAN:  1. Diastolic heart failure: is presently stable will continue lasix 20 mg daily asa 81 mg daily 02 at 2 liters;  and will monitor her status.   2. RLS: no complaints of pain present; will continue requip 1 mg nightly and will monitor her status.   3.  Insomnia: will continue trazodone 100 mg nightly   4. Protein calorie malnutrition: she continues on supplements per facility protocol; her current weight is 127 pounds; she is followed by hospice care. She has been taking the remeron for more than one month without effective treatment; will stop at this time. Will stop her mvi as well  5. [name]: will continue prilosec 20 mg daily and maalox 30 cc daily   6. Constipation: will continue senna s 2 tabs nightly   7. Spinal stenosis with chronic pain: no indications of pain present; will continue duragesic 25 mcg patch every 3 days; will continue oxyfast 5 mg every 4 hours as needed and will monitor her status   8. Anxiety: she is presently without change will continue ativan 0.5 mg twice daily with 1 m every 4 hours as needed will monitor   9. Glaucoma: will continue Xalatan to goth eyes nightly    Synthia Innocent NP [facility]  Contact [phone] Monday through Friday 8am- 5pm  After hours call [phone]

## 2014-03-26 IMAGING — CT CT HEAD W/O CM
1 of 2 series · 13 of 30 positions shown, 17 images · non-contrast
Comparison: None.

CLINICAL DATA: Altered mental status.  Lethargy.

CT HEAD WITHOUT CONTRAST
TECHNIQUE: Contiguous axial images were obtained from the base of
the skull through the vertex without contrast.

[Series 2: brain · axial · 0.51mm/px · z∈[+130,+262]mm · 13 of 52 slices shown, 17 images]
[im 4/52  brain]
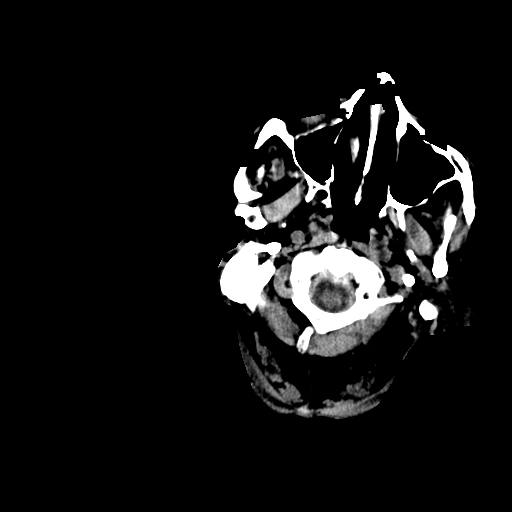
[im 4/52  bone]
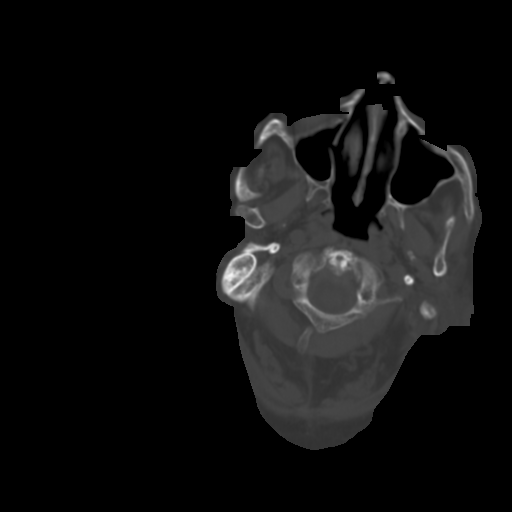
[im 8/52  brain]
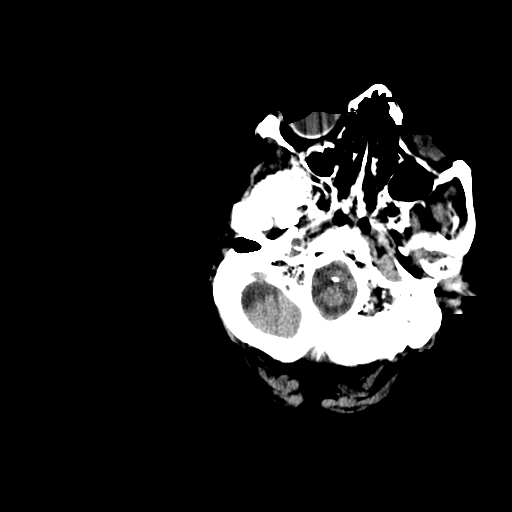
[im 11/52  brain]
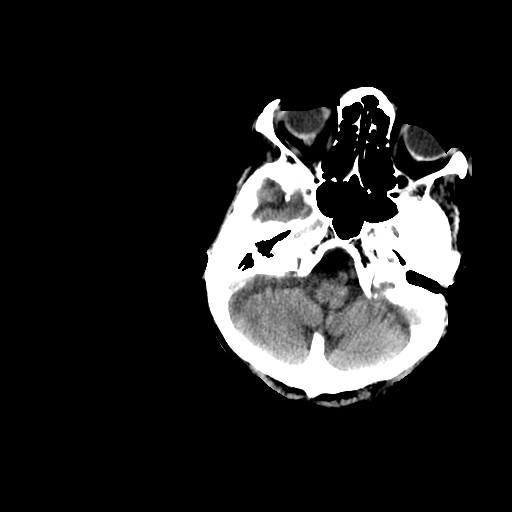
[im 15/52  brain]
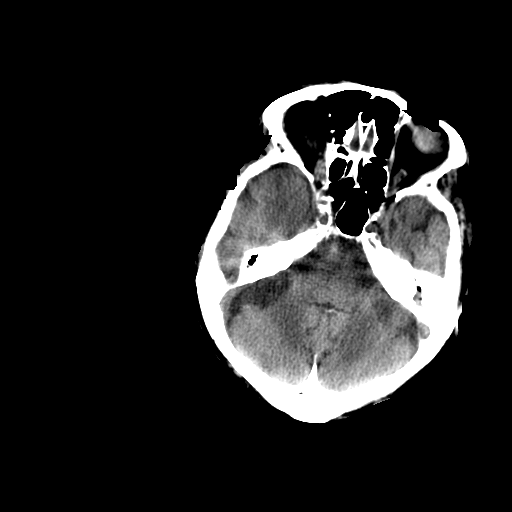
[im 19/52  brain]
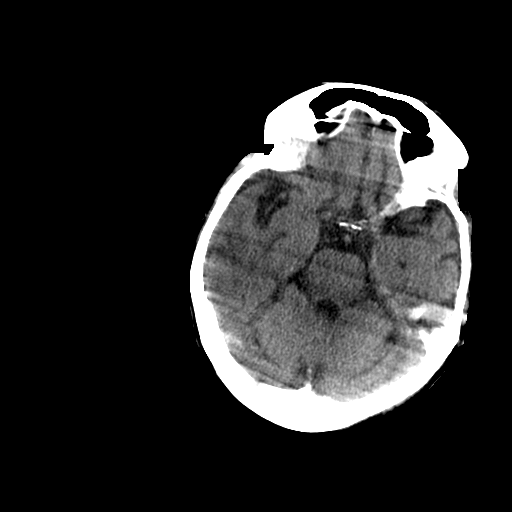
[im 19/52  bone]
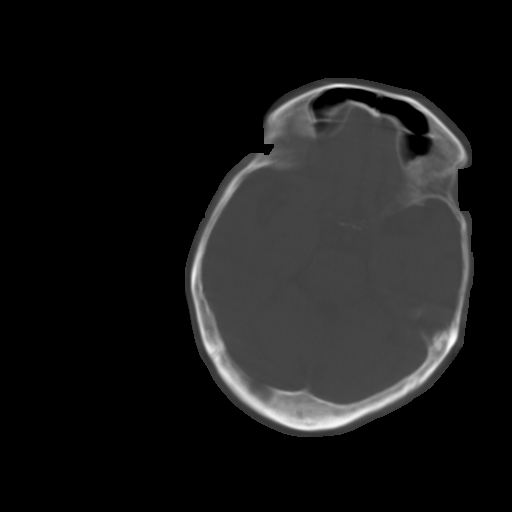
[im 22/52  brain]
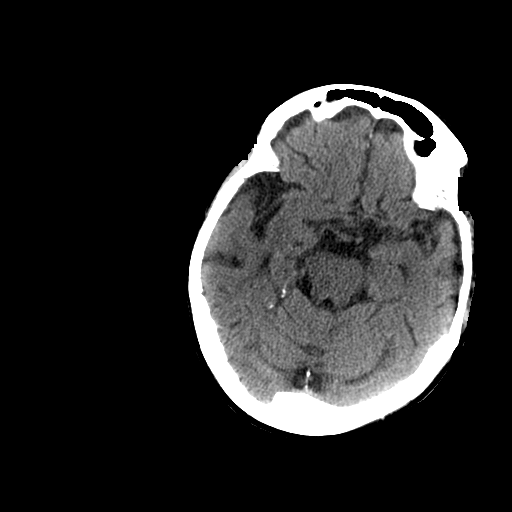
[im 26/52  brain]
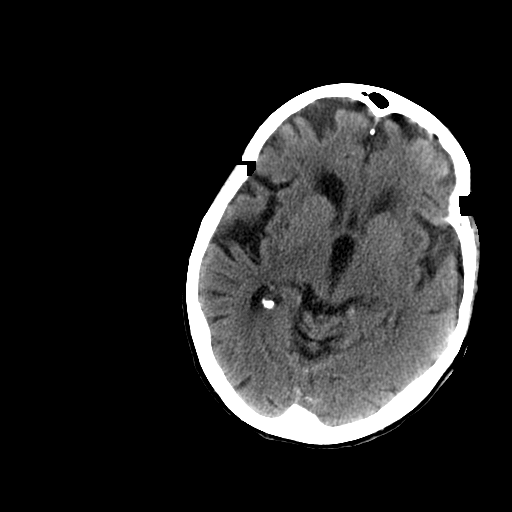
[im 30/52  brain]
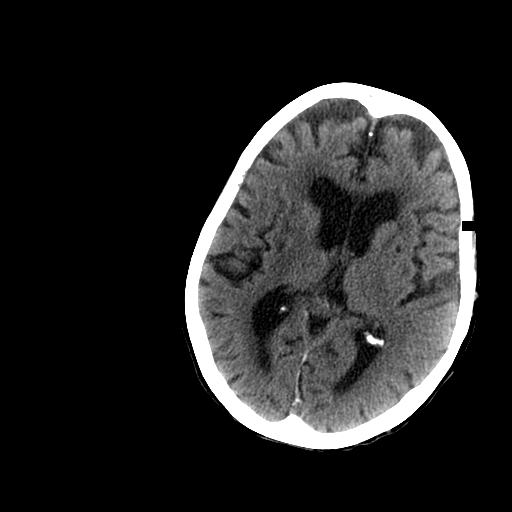
[im 33/52  brain]
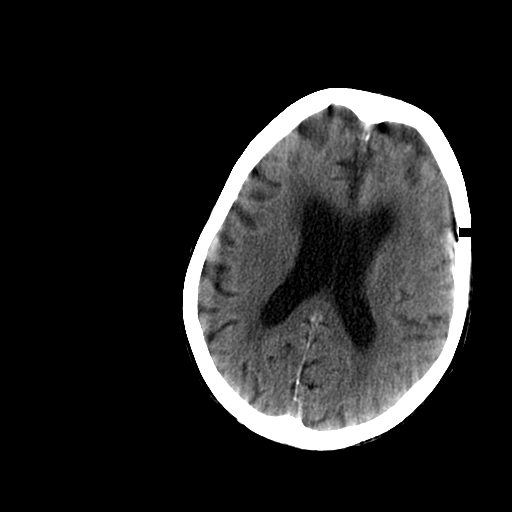
[im 33/52  bone]
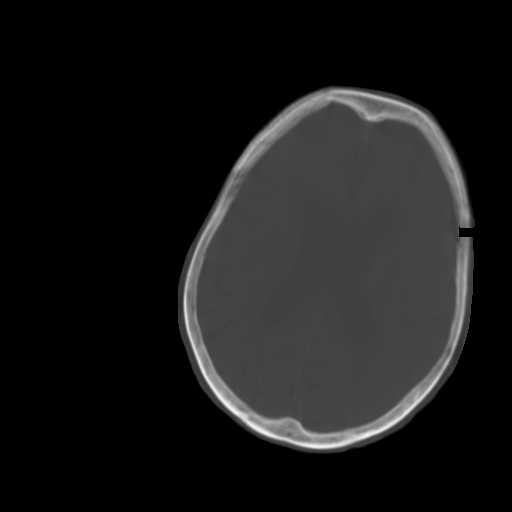
[im 37/52  brain]
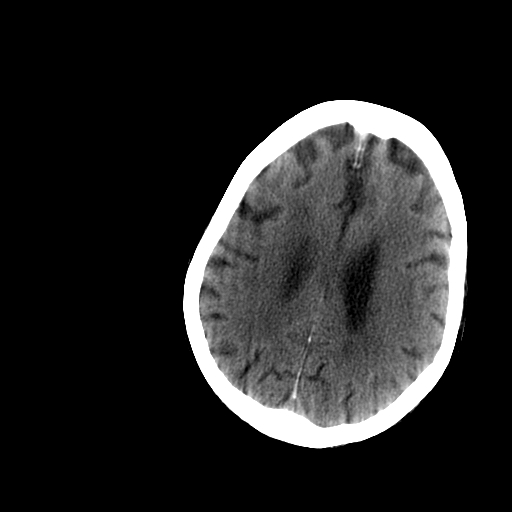
[im 41/52  brain]
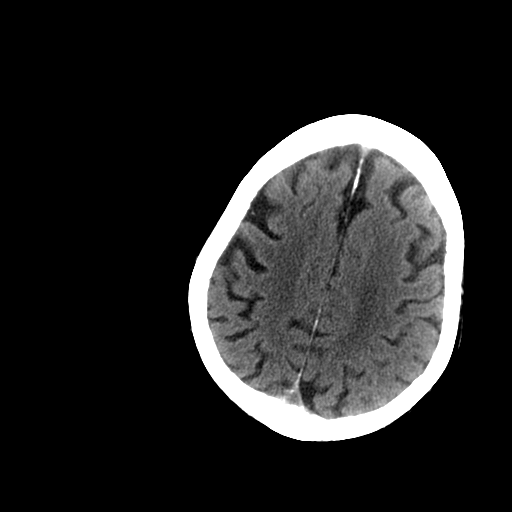
[im 44/52  brain]
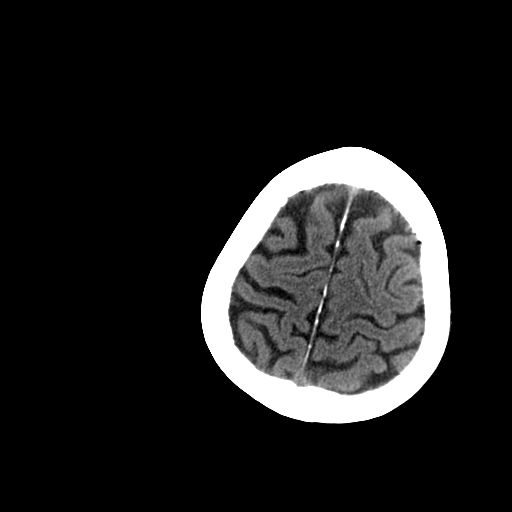
[im 48/52  brain]
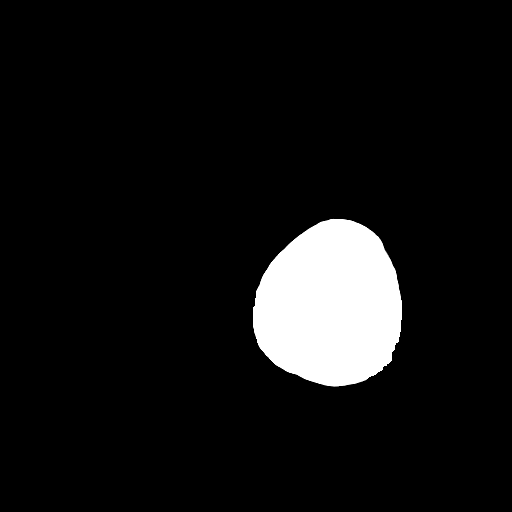
[im 48/52  bone]
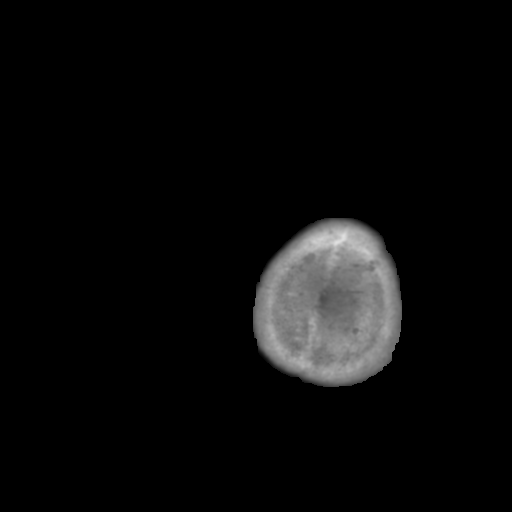

[13 of 30 positions shown; findings below may reference images not displayed]

FINDINGS: There is no evidence of acute intracranial hemorrhage,
mass effect, brain edema or extra-axial fluid collection.  The
ventricles and subarachnoid spaces are diffusely prominent
consistent with moderate atrophy. Diffuse periventricular low
density is most compatible with mild chronic small vessel ischemic
change.   No acute cortical infarction is seen.

The visualized paranasal sinuses are clear. The calvarium is
intact.
IMPRESSION: Generalized atrophy and mild chronic small vessel ischemic changes.
No acute or reversible findings identified.

## 2014-03-26 IMAGING — CR DG CHEST 1V PORT
1 series · 1 of 1 positions shown · non-contrast
Comparison: None.

CLINICAL DATA: Short of breath

PORTABLE CHEST - 1 VIEW

[AP]
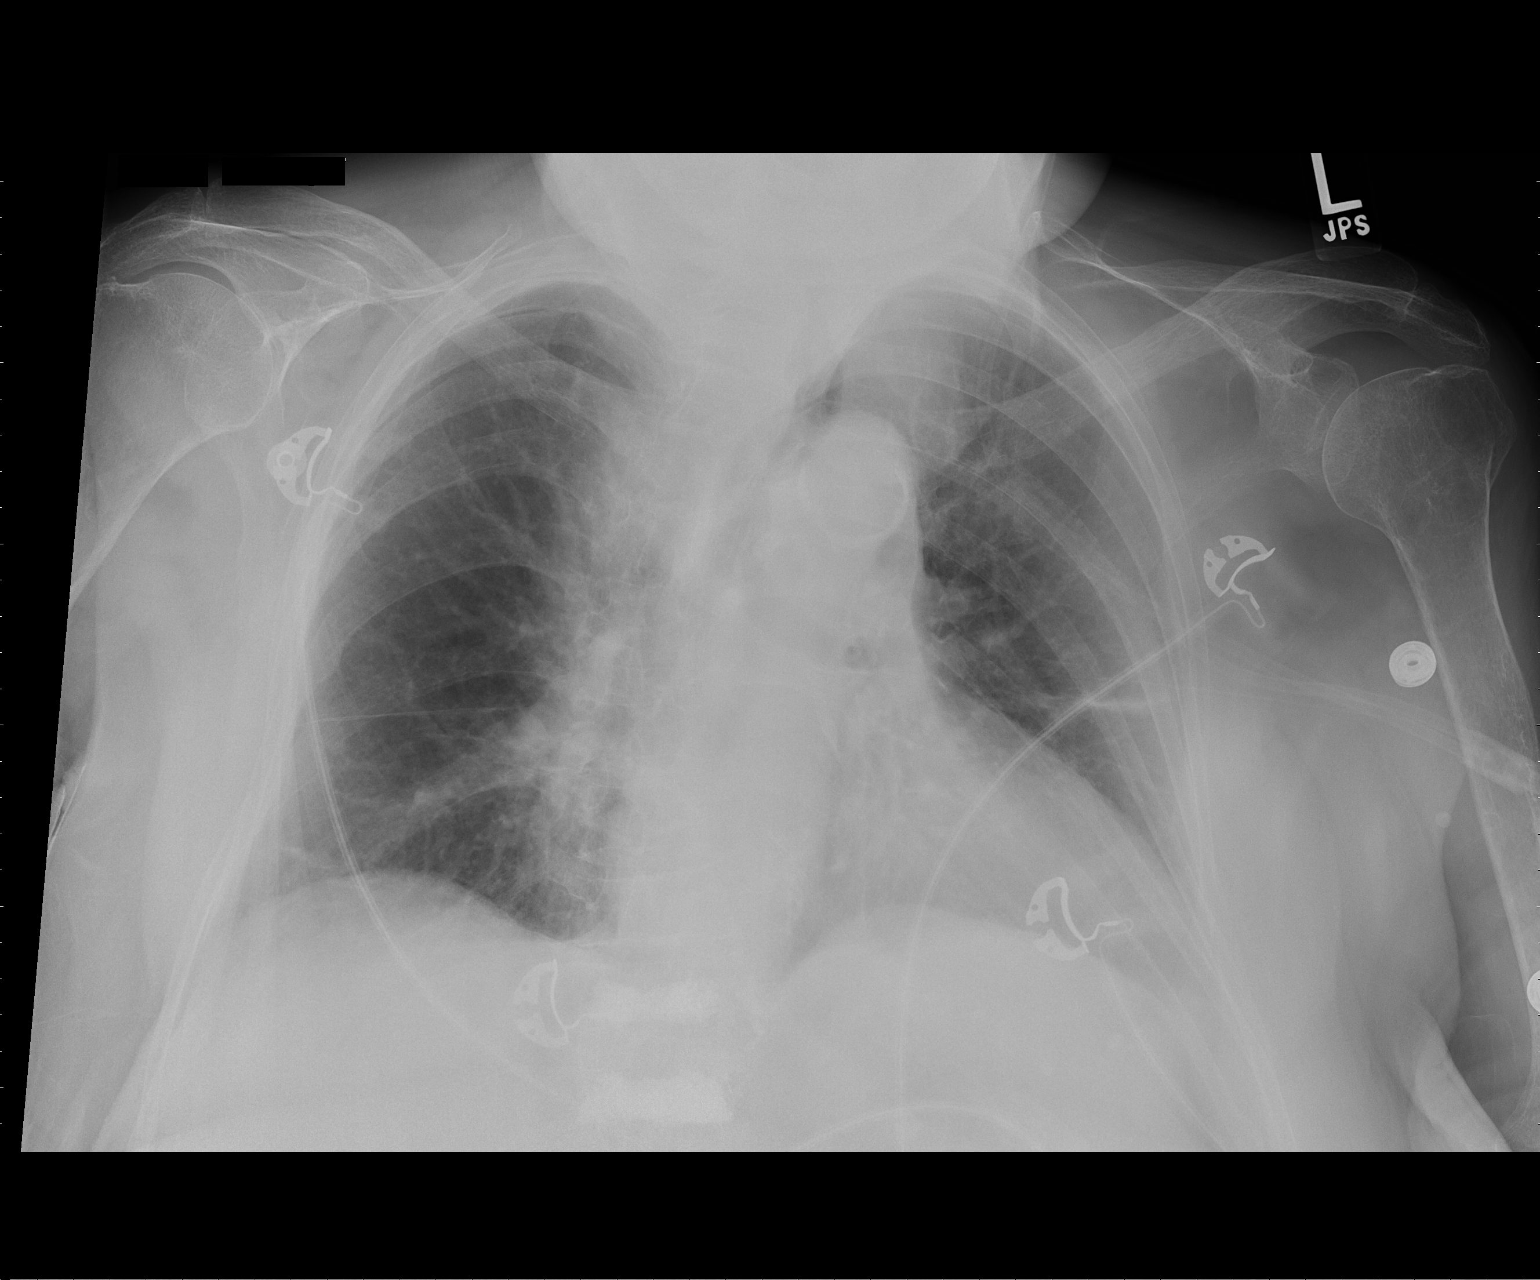

[1 of 1 positions shown; findings below may reference images not displayed]

FINDINGS: Moderate cardiomegaly.  Low volumes.  Linear atelectasis
at the right base and left midlung zone.  Normal vascularity.
Thorax is rotated to the left.  No obvious mediastinal mass effect.
Status post vertebral plasty at three contiguous lower thoracic
upper lumbar vertebral levels.  No Kerley B lines to suggest edema.
Osteopenia.  No pneumothorax.
IMPRESSION: Cardiomegaly without edema.  Bilateral linear atelectasis.

## 2014-04-07 ENCOUNTER — Other Ambulatory Visit: Payer: Self-pay | Admitting: *Deleted

## 2014-04-07 ENCOUNTER — Encounter: Payer: Self-pay | Admitting: Internal Medicine

## 2014-04-07 MED ORDER — AMBULATORY NON FORMULARY MEDICATION: Status: DC

## 2014-04-07 NOTE — Telephone Encounter (Signed)
[facility] 

## 2014-04-07 NOTE — Progress Notes (Signed)
Patient ID: Candice Barker, female   DOB: Jan 01, 1917, 80 y.o.   MRN: [id number]  Location:  [facility] Provider:  [name]. Renato Gails, D.O., C.M.D.  Code Status:  DNR  Chief Complaint  Patient presents with  . Hospitalization Follow-up    new admission s/p hospitalization for     HPI:  80 yo white female with h/o diastolic chf, severe aortic stenosis, CKD, mild dementia was admitted her for rehab s/p hospitalization.  Her granddaughter requested to meet with me due to her concerns that her grandma actually needs hospice.  Upon review of the chart, this seemed appropriate due to her difficulty with ongoing dyspnea, some chest pains and her poor renal function and valvular disease making diuresis a very sensitive issue.   Review of Systems:  Review of Systems  Constitutional: Positive for malaise/fatigue. Negative for fever.  HENT: Negative for congestion.   Eyes: Negative for blurred vision.  Respiratory: Positive for shortness of breath.   Cardiovascular: Positive for chest pain and leg swelling.  Gastrointestinal: Positive for constipation. Negative for blood in stool and melena.  Genitourinary: Negative for dysuria.  Musculoskeletal: Positive for myalgias and back pain. Negative for falls.  Skin: Negative for rash.  Neurological: Positive for dizziness and weakness. Negative for loss of consciousness.  Endo/Heme/Allergies: Bruises/bleeds easily.  Psychiatric/Behavioral: Positive for depression and memory loss.    Medications: Patient's Medications  New Prescriptions   LORAZEPAM (ATIVAN) 0.5 MG TABLET    Take one tablet by mouth twice daily for anxiety  Previous Medications   ALUMINUM-MAGNESIUM HYDROXIDE-SIMETHICONE (MAALOX) 200-200-20 MG/5ML SUSP    Take 30 mLs by mouth every morning.   ASPIRIN 81 MG TABLET    Take 81 mg by mouth daily.   CALCIUM CARBONATE (TUMS - DOSED IN MG ELEMENTAL CALCIUM) 500 MG CHEWABLE TABLET    Chew 1 tablet by mouth every 4 (four) hours as  needed for indigestion or heartburn.   DEXTROMETHORPHAN 15 MG/5ML SYRUP    Take 10 mLs by mouth 4 (four) times daily as needed for cough.   FUROSEMIDE (LASIX) 20 MG TABLET    Take 1 tablet (20 mg total) by mouth daily. hOLD FOR sbp < 110   HYDROXYZINE (ATARAX/VISTARIL) 10 MG TABLET    Take 1 tablet (10 mg total) by mouth every 6 (six) hours as needed for itching or anxiety.   LATANOPROST (XALATAN) 0.005 % OPHTHALMIC SOLUTION    Place 1 drop into both eyes at bedtime.   NITROGLYCERIN (NITROSTAT) 0.4 MG SL TABLET    Place 0.4 mg under the tongue every 5 (five) minutes x 3 doses as needed. For chest pain   OMEPRAZOLE (PRILOSEC) 20 MG CAPSULE    Take 20 mg by mouth daily.   OXYCODONE (ROXICODONE INTENSOL) 20 MG/ML CONCENTRATED SOLUTION    Take 5 mg by mouth every 4 (four) hours as needed for severe pain.   PRAMOXINE (SARNA SENSITIVE) 1 % LOTN    Apply 1 application topically 2 (two) times daily.   PROMETHAZINE (PHENERGAN) 12.5 MG TABLET    Take 12.5 mg by mouth every 4 (four) hours as needed for nausea or vomiting.   ROPINIROLE (REQUIP) 1 MG TABLET    Take 1 mg by mouth at bedtime. For restless leg syndrome   SENNA-DOCUSATE (SENOKOT-S) 8.6-50 MG PER TABLET    Take 2 tablets by mouth at bedtime.    TRAZODONE (DESYREL) 50 MG TABLET    Take 100 mg by mouth at bedtime.   Modified  Medications   Modified Medication Previous Medication   FENTANYL (DURAGESIC) 25 MCG/HR PATCH fentaNYL (DURAGESIC) 25 MCG/HR patch      Place 1 patch (25 mcg total) onto the skin every 3 (three) days.    Place 1 patch (25 mcg total) onto the skin every 3 (three) days.   LORAZEPAM (ATIVAN) 1 MG TABLET LORazepam (ATIVAN) 1 MG tablet      Take 1 mg by mouth every 4 (four) hours as needed.     Take 1 tablet (1 mg total) by mouth every 4 (four) hours as needed for anxiety.  Discontinued Medications   ACIDOPHILUS (RISAQUAD) CAPS CAPSULE    Take 1 capsule by mouth daily.   LATANOPROST (XALATAN) 0.005 % OPHTHALMIC SOLUTION    Place 1  drop into both eyes at bedtime.   MORPHINE SULFATE (MORPHINE CONCENTRATE) 10 MG / 0.5 ML CONCENTRATED SOLUTION    Take 0.25 mLs (5 mg total) by mouth every 2 (two) hours as needed for moderate pain or shortness of breath.   NYSTATIN (MYCOSTATIN/NYSTOP) 100000 UNIT/GM POWD    Apply 15 g topically every evening.   ONDANSETRON (ZOFRAN) 4 MG/5ML SOLUTION    Take 5 mLs (4 mg total) by mouth every 8 (eight) hours as needed for nausea or vomiting.   POLYETHYLENE GLYCOL (MIRALAX / GLYCOLAX) PACKET    Take 17 g by mouth daily as needed for moderate constipation.   PSYLLIUM (REGULOID) 0.52 G CAPSULE    Take 1.04 g by mouth at bedtime.    Physical Exam: Physical Exam  Constitutional: She is oriented to person, place, and time. She appears well-developed and well-nourished.  Cardiovascular: Normal rate and regular rhythm.   Murmur heard. 3/6 systolic murmur throughout precordium but loudest at 2nd ics on right  Pulmonary/Chest: Effort normal and breath sounds normal. No respiratory distress. She has no rales.  Wearing her O2  Abdominal: Soft. Bowel sounds are normal. She exhibits no distension and no mass. There is no tenderness.  Musculoskeletal: Normal range of motion.  Gets around in wheelchair, used walker at [facility] place  Neurological: She is alert and oriented to person, place, and time.  Skin: Skin is warm and dry. There is pallor.  Psychiatric: She has a normal mood and affect.     Labs reviewed: Basic Metabolic Panel:  Recent Labs  67/20/94 1208 10/09/13 0404 10/10/13 0329  NA 139 137 134*  K 4.7 4.6 4.4  CL 97 94* 94*  CO2 23 24 21   GLUCOSE 85 112* 117*  BUN 45* 50* 64*  CREATININE 2.16* 2.35* 2.48*  CALCIUM 9.0 8.9 8.2*  MG 1.7  --   --     Liver Function Tests:  Recent Labs  10/07/13 1418  AST 12  ALT 7  ALKPHOS 62  BILITOT 0.2*  PROT 7.3  ALBUMIN 4.0    CBC:  Recent Labs  10/07/13 1418 10/08/13 1208 10/10/13 0329  WBC 7.2 7.7 7.2  NEUTROABS 5.9   --   --   HGB 8.6* 9.8* 9.4*  HCT 26.6* 31.0* 29.1*  MCV 93.0 92.8 92.4  PLT 207 249 222    Assessment/Plan 1. Acute diastolic CHF (congestive heart failure) -is very fragile in terms of diuresing considering her severe as and chronic kidney disease -her goals are comfort so hospice has been consulted at her granddaughter's request  2. Severe aortic stenosis -cont current regimen -avoid too aggressive diuresis to prevent bp from bottoming out   3. CKD (chronic kidney disease), stage  III -monitoring carefully also, does not help with her edema -elevate legs at rest and use compression hose  4. Spinal stenosis of lumbar region -has chronic back pain from this--cont fentanyl with prn oxycodone for breakthrough  5. Protein malnutrition -encourage supplements -due to goals of care, her diet will be liberal and she may eat her [place] meats and snacks in her fridge  Family/ staff Communication: had about 1 hour meeting with her granddaughter today  Goals of care: DNR, hospice, comfort, quality not quantity  Labs/tests ordered:  Cbc, bmp to assess if renal function has improved at all per family request

## 2014-04-08 ENCOUNTER — Other Ambulatory Visit: Payer: Self-pay | Admitting: *Deleted

## 2014-04-08 MED ORDER — AMBULATORY NON FORMULARY MEDICATION: Status: DC

## 2014-04-08 NOTE — Telephone Encounter (Signed)
[facility] 

## 2014-04-09 ENCOUNTER — Other Ambulatory Visit: Payer: Self-pay | Admitting: *Deleted

## 2014-04-09 MED ORDER — AMBULATORY NON FORMULARY MEDICATION: Status: DC

## 2014-04-09 NOTE — Telephone Encounter (Signed)
[facility] 

## 2014-04-21 ENCOUNTER — Other Ambulatory Visit: Payer: Self-pay | Admitting: *Deleted

## 2014-04-21 MED ORDER — AMBULATORY NON FORMULARY MEDICATION: Status: DC

## 2014-04-21 NOTE — Telephone Encounter (Signed)
[facility]

## 2014-04-28 ENCOUNTER — Encounter: Payer: Self-pay | Admitting: Adult Health

## 2014-04-28 ENCOUNTER — Non-Acute Institutional Stay (SKILLED_NURSING_FACILITY): Payer: Medicare Other | Admitting: Adult Health

## 2014-04-28 DIAGNOSIS — N183 Chronic kidney disease, stage 3 unspecified: Secondary | ICD-10-CM

## 2014-04-28 DIAGNOSIS — I5032 Chronic diastolic (congestive) heart failure: Secondary | ICD-10-CM

## 2014-04-28 DIAGNOSIS — R627 Adult failure to thrive: Secondary | ICD-10-CM | POA: Insufficient documentation

## 2014-04-28 MED ORDER — LORAZEPAM 2 MG/ML PO CONC: 0.5000 mg | ORAL | Status: AC | PRN

## 2014-04-28 NOTE — Progress Notes (Signed)
Patient ID: Candice Barker, female   DOB: 08-22-1916, 81 y.o.   MRN: [id number]  [facility]     No Known Allergies  CODE STATUS: DNR; NO HOSPITALIZATION; NO IVF; NO ABT; NO TUBE FEEDING   Chief Complaint  Patient presents with  . Medical Management of Chronic Issues    HPI:  She is a long term resident of this facility being seen for the management of her chronic illnesses. She is followed by hospice care. She has declined. She is actively dying. She is not taking medications by mouth at this time; has had very little po intake. Her eyes are open; but she is not responsive. I have spoken with her family. They want for comfort care only.   Past Medical History  Diagnosis Date  . Heart failure   . Spinal stenosis   . Edema   . Glaucoma   . Diabetes mellitus without complication   . GERD (gastroesophageal reflux disease)   . Depression   . Bronchitis   . Endocarditis   . Cardiomyopathy 10/09/2013    Past Surgical History  Procedure Laterality Date  . Appendectomy    . Knee surgery    . Bilateral catract extraction      VITAL SIGNS BP 114/59 mmHg  Pulse 62  Ht 5' (1.524 m)  Wt 134 lb (60.782 kg)  BMI 26.17 kg/m2  SpO2 90%   Outpatient Encounter Prescriptions as of 04/28/2014  Medication Sig  . aluminum-magnesium hydroxide-simethicone (MAALOX) 200-200-20 MG/5ML SUSP Take 30 mLs by mouth every morning.  . AMBULATORY NON FORMULARY MEDICATION Morphine Sul Sol 100/66ml Sig: Give 0.65ml by mouth every 2 hours as needed for pain or SOB  . aspirin 81 MG tablet Take 81 mg by mouth daily.  . calcium carbonate (TUMS - DOSED IN MG ELEMENTAL CALCIUM) 500 MG chewable tablet Chew 1 tablet by mouth every 4 (four) hours as needed for indigestion or heartburn.  . dextromethorphan 15 MG/5ML syrup Take 10 mLs by mouth 4 (four) times daily as needed for cough.  . fentaNYL (DURAGESIC) 25 MCG/HR patch Place 1 patch (25 mcg total) onto the skin every 3 (three) days.  . furosemide (LASIX) 20  MG tablet Take 1 tablet (20 mg total) by mouth daily. hOLD FOR sbp < 110  . hydrOXYzine (ATARAX/VISTARIL) 10 MG tablet Take 1 tablet (10 mg total) by mouth every 6 (six) hours as needed for itching or anxiety.  Marland Kitchen latanoprost (XALATAN) 0.005 % ophthalmic solution Place 1 drop into both eyes at bedtime.  Marland Kitchen LORazepam (ATIVAN) 0.5 MG tablet Take one tablet by mouth twice daily for anxiety  . LORazepam (ATIVAN) 1 MG tablet Take 1 mg by mouth every 4 (four) hours as needed.   Marland Kitchen morphine (ROXANOL) 20 MG/ML concentrated solution Take 5 mg by mouth every 2 (two) hours as needed for severe pain.  . nitroGLYCERIN (NITROSTAT) 0.4 MG SL tablet Place 0.4 mg under the tongue every 5 (five) minutes x 3 doses as needed. For chest pain  . omeprazole (PRILOSEC) 20 MG capsule Take 20 mg by mouth daily.  . pramoxine (SARNA SENSITIVE) 1 % LOTN Apply 1 application topically 2 (two) times daily.  . promethazine (PHENERGAN) 12.5 MG tablet Take 12.5 mg by mouth every 4 (four) hours as needed for nausea or vomiting.  Marland Kitchen rOPINIRole (REQUIP) 1 MG tablet Take 1 mg by mouth at bedtime. For restless leg syndrome  . senna-docusate (SENOKOT-S) 8.6-50 MG per tablet Take 2 tablets by mouth at bedtime.   Marland Kitchen  traZODone (DESYREL) 50 MG tablet Take 100 mg by mouth at bedtime.   . [DISCONTINUED] oxyCODONE (ROXICODONE INTENSOL) 20 MG/ML concentrated solution Take 5 mg by mouth every 4 (four) hours as needed for severe pain.     SIGNIFICANT DIAGNOSTIC EXAMS   LABS REVIEWED:   10-17-13: glucose 107; bun 43.3; creat 1.86; k+4.8; na++143    Review of Systems  Unable to perform ROS    Physical Exam  Constitutional: No distress.  Frail   Neck: Neck supple. No JVD present.  Cardiovascular: Normal rate, regular rhythm and intact distal pulses.  has murmur  Respiratory: Effort normal and breath sounds normal. No respiratory distress.  GI: Soft. Bowel sounds are normal. She exhibits no distension. There is no tenderness.    Musculoskeletal: She exhibits no edema.  Is able to move all extremities   Neurological: eyes open unable to track  Skin: Skin is warm and dry. She is not diaphoretic.    ASSESSMENT/ PLAN:  1. Diastolic heart failure 2. FTT 3. ckd stage III Will stop all medications except roxanol and duragesic will change her ativan to concentrate formulation  0.5 mg every 4 hours as needed  Will stop her daily weights Will use liquid tears to both eyes four times daily as needed  Will focus her care upon comfort only and will monitor  Time spent with patient 45 minutes.    Synthia Innocent NP [facility]  Contact [phone] Monday through Friday 8am- 5pm  After hours call [phone]

## 2014-05-25 DEATH — deceased

## 2015-08-04 IMAGING — CR DG CHEST 2V
2 series · 2 of 2 positions shown · non-contrast
Comparison: Chest x-ray 05/30/2012.

CLINICAL DATA: Recently diagnosis with right lower lobe pneumonia.
Persistent chest discomfort, congestion, cough and shortness of
breath.

EXAM:
CHEST  2 VIEW

[w chest lat]
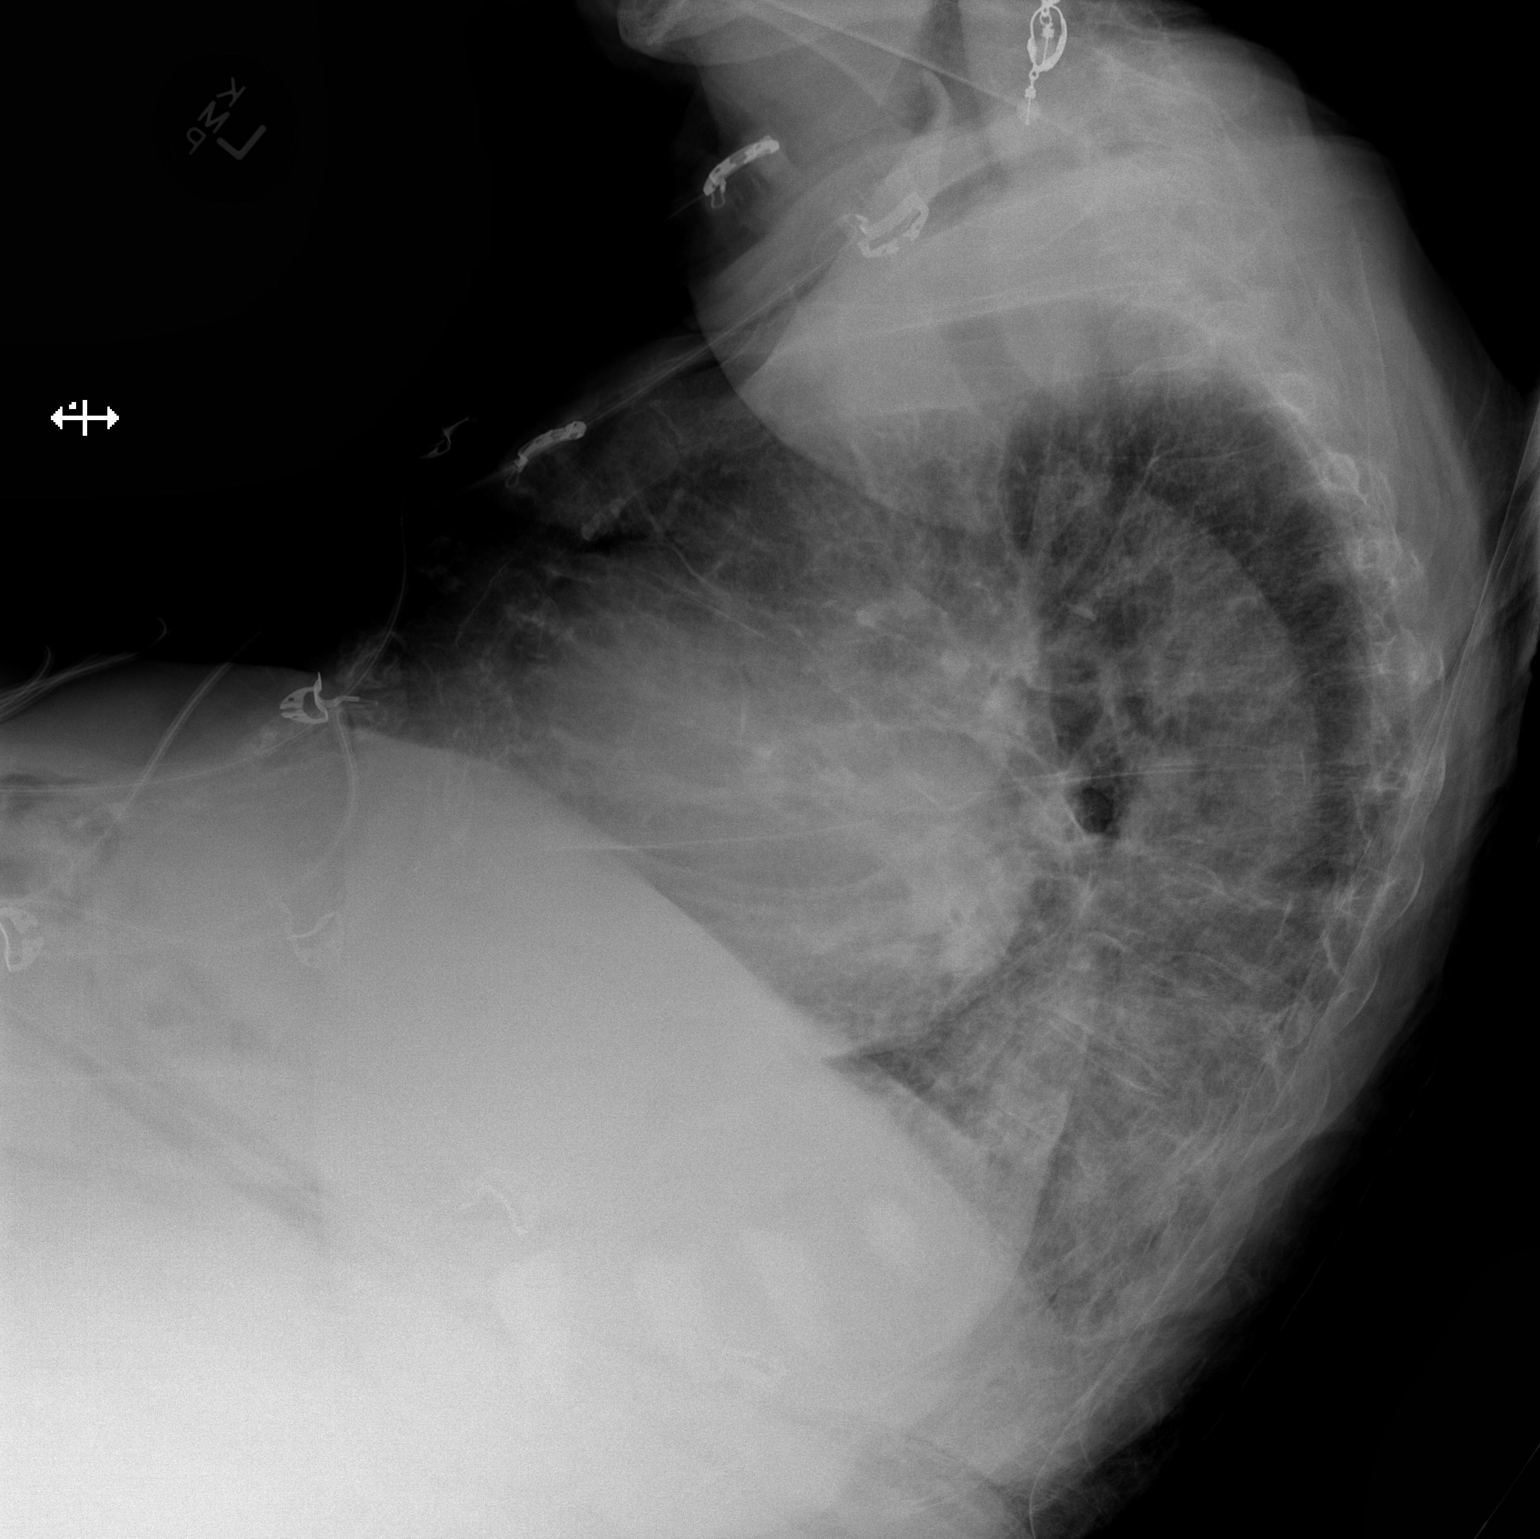

[x chest ap]
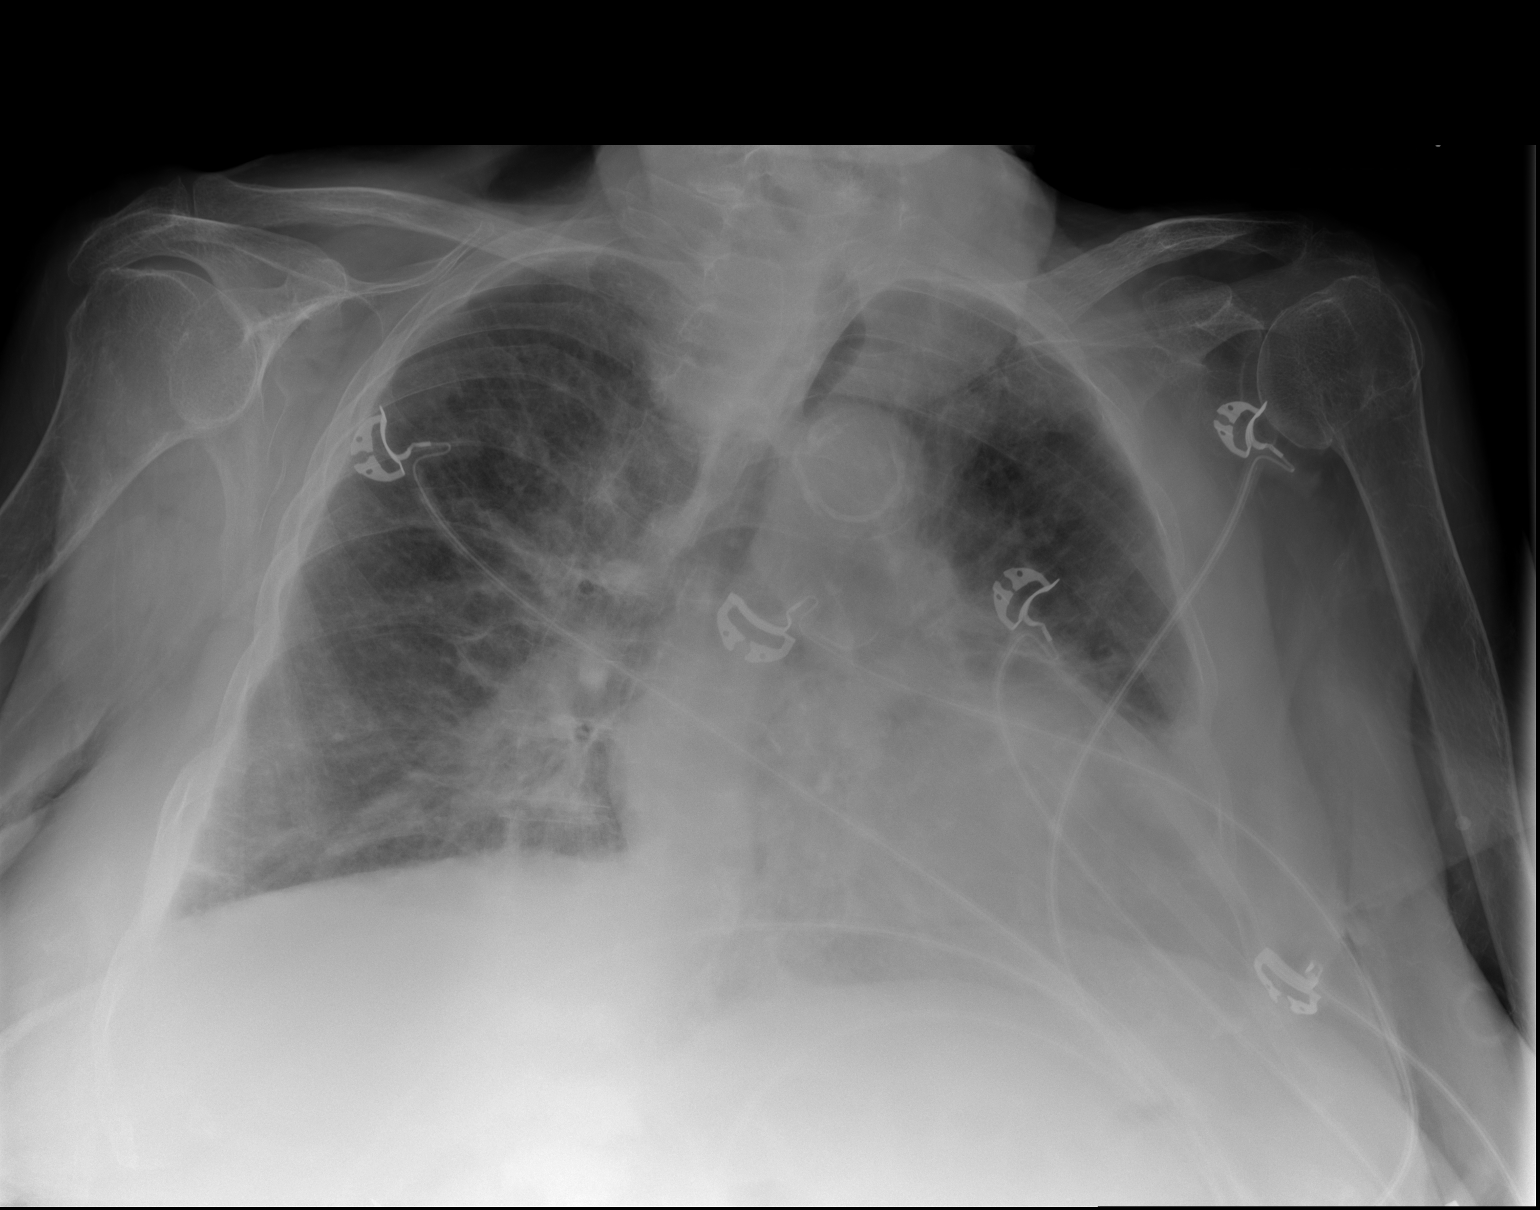

[2 of 2 positions shown; findings below may reference images not displayed]

FINDINGS: Bibasilar opacities may reflect areas of atelectasis and/or
consolidation. Probable small left pleural effusion. In addition,
there is some cephalization of the pulmonary vasculature with a
background of indistinct interstitial markings and some Kerley
B-lines in the lower right long, indicative of a background of mild
interstitial pulmonary edema. Mild cardiomegaly. The patient is
rotated to the left on today's exam, resulting in distortion of the
mediastinal contours and reduced diagnostic sensitivity and
specificity for mediastinal pathology. Atherosclerosis in the
thoracic aorta.
IMPRESSION: 1. Bibasilar opacities may reflect areas of atelectasis and/or
consolidation in the lower lobes of the lungs bilaterally.
2. Mild cardiomegaly with evidence of mild interstitial pulmonary
edema and small left pleural effusion. These findings suggest
concurrent congestive heart failure.
3. Atherosclerosis.

## 2017-12-12 ENCOUNTER — Encounter: Payer: Self-pay | Admitting: Internal Medicine
